# Patient Record
Sex: Male | Born: 1950 | Race: Black or African American | Hispanic: No | Marital: Married | Smoking: Never smoker
Health system: Southern US, Community
[De-identification: ages and names within clinical notes are randomized; demographics above are authoritative.]

## PROBLEM LIST (undated history)

## (undated) DIAGNOSIS — I10 Essential (primary) hypertension: Secondary | ICD-10-CM

---

## 2006-08-22 ENCOUNTER — Encounter (INDEPENDENT_AMBULATORY_CARE_PROVIDER_SITE_OTHER): Payer: Self-pay | Admitting: Gastroenterology

## 2006-08-22 ENCOUNTER — Ambulatory Visit (HOSPITAL_COMMUNITY): Admission: RE | Admit: 2006-08-22 | Discharge: 2006-08-22 | Payer: Self-pay | Admitting: Gastroenterology

## 2009-12-21 ENCOUNTER — Emergency Department (HOSPITAL_COMMUNITY): Admission: EM | Admit: 2009-12-21 | Discharge: 2009-12-21 | Payer: Self-pay | Admitting: Emergency Medicine

## 2010-04-21 LAB — POCT CARDIAC MARKERS
CKMB, poc: 1.2 ng/mL (ref 1.0–8.0)
CKMB, poc: <1 ng/mL — ABNORMAL LOW (ref 1.0–8.0)
Myoglobin, poc: 117 ng/mL (ref 12–200)
Myoglobin, poc: 130 ng/mL (ref 12–200)
Troponin i, poc: <0.05 ng/mL (ref 0.00–0.09)
Troponin i, poc: <0.05 ng/mL (ref 0.00–0.09)

## 2010-04-21 LAB — CBC
HCT: 35.5 % — ABNORMAL LOW (ref 39.0–52.0)
Hemoglobin: 11.5 g/dL — ABNORMAL LOW (ref 13.0–17.0)
MCH: 24.6 pg — ABNORMAL LOW (ref 26.0–34.0)
MCHC: 32.4 g/dL (ref 30.0–36.0)
MCV: 75.9 fL — ABNORMAL LOW (ref 78.0–100.0)
Platelets: 247 10*3/uL (ref 150–400)
RBC: 4.68 MIL/uL (ref 4.22–5.81)
RDW: 13.5 % (ref 11.5–15.5)
WBC: 4.4 10*3/uL (ref 4.0–10.5)

## 2010-04-21 LAB — DIFFERENTIAL
Basophils Absolute: 0 10*3/uL (ref 0.0–0.1)
Basophils Relative: 0 % (ref 0–1)
Eosinophils Absolute: 0.1 10*3/uL (ref 0.0–0.7)
Eosinophils Relative: 2 % (ref 0–5)
Lymphocytes Relative: 31 % (ref 12–46)
Lymphs Abs: 1.4 10*3/uL (ref 0.7–4.0)
Monocytes Absolute: 0.4 10*3/uL (ref 0.1–1.0)
Monocytes Relative: 9 % (ref 3–12)
Neutro Abs: 2.5 10*3/uL (ref 1.7–7.7)
Neutrophils Relative %: 57 % (ref 43–77)

## 2010-04-21 LAB — URINALYSIS, ROUTINE W REFLEX MICROSCOPIC
Bilirubin Urine: NEGATIVE
Glucose, UA: NEGATIVE mg/dL
Hgb urine dipstick: NEGATIVE
Ketones, ur: NEGATIVE mg/dL
Nitrite: NEGATIVE
Protein, ur: NEGATIVE mg/dL
Specific Gravity, Urine: 1.022 (ref 1.005–1.030)
Urobilinogen, UA: 0.2 mg/dL (ref 0.0–1.0)
pH: 6.5 (ref 5.0–8.0)

## 2010-04-21 LAB — BASIC METABOLIC PANEL
BUN: 16 mg/dL (ref 6–23)
CO2: 29 mEq/L (ref 19–32)
Calcium: 9.7 mg/dL (ref 8.4–10.5)
Chloride: 101 mEq/L (ref 96–112)
Creatinine, Ser: 1.5 mg/dL (ref 0.4–1.5)
GFR calc Af Amer: 58 mL/min — ABNORMAL LOW (ref >60)
GFR calc non Af Amer: 48 mL/min — ABNORMAL LOW (ref >60)
Glucose, Bld: 109 mg/dL — ABNORMAL HIGH (ref 70–99)
Potassium: 4.3 mEq/L (ref 3.5–5.1)
Sodium: 136 mEq/L (ref 135–145)

## 2010-06-23 NOTE — Op Note (Signed)
NAME:  Blake Mcclure, Blake Mcclure NO.:  [id number]   MEDICAL RECORD NO.:  [id number]          PATIENT TYPE:  AMB   LOCATION:  ENDO                         FACILITY:  [facility]   PHYSICIAN:  Anselmo Rod, M.D.  DATE OF BIRTH:  [birth year]   DATE OF PROCEDURE:  08/22/2006  DATE OF DISCHARGE:                               OPERATIVE REPORT   ROCEDURE PERFORMED:  Colonoscopy with cold biopsies x2.   ENDOSCOPIST:  Anselmo Rod, MD   INSTRUMENT USED:  Pentax video colonoscope.   INDICATIONS FOR PROCEDURE:  A 60 year old African American male  undergoing screening colonoscopy to rule out colonic polyps, masses,  etc.   PREPROCEDURE PREPARATION:  Informed consent was procured from the  patient. The patient was fasted for 4 hours prior to the procedure and  prepped with 20 Osmoprep pills the night of and 12 Osmoprep pills the  morning of the procedure.  Risks and benefits of the procedure including  a 10% miss rate of cancer in polyps were discussed with the patient as  well.   PREPROCEDURE PHYSICAL:  VITAL SIGNS:  The patient had stable vital  signs.  NECK:  Supple.  CHEST:  Clear to auscultation.  S1 and S2 regular.  ABDOMEN:  Soft with normal bowel sounds.   DESCRIPTION OF THE PROCEDURE:  The patient was placed in the left  lateral decubitus position and sedated with 100 mcg of Fetanyl and 10 mg  of Versed given intravenously in slow incremental doses. Once the  patient was adequately sedated and maintained on low-flow oxygen and  continuous cardiac monitoring, the Pentax video colonoscope was advanced  from the rectum to the cecum.  Two small sessile polyps were biopsied  from the rectum. The colon was otherwise normal.  The prep was  excellent.  The appendiceal orifice and ileocecal valve were visualized  and photographed.  The terminal ileum was not visualized.  No other  masses or polyps were seen.  There was no evidence of diverticulosis.  The patient tolerated the  procedure well without complications.  Retroflexion in the rectum revealed no abnormalities.   IMPRESSION:  1. Two small sessile polyps biopsied from the rectum, otherwise normal      exam up to the cecum.  2. No abnormalities noted on retroflexion.  3. No evidence of diverticulosis, hemorrhoids, and so forth.   RECOMMENDATIONS:  1. Await pathology results.  2. Avoid all nonsteroidals including aspirin for the next 2 weeks.  3. Repeat colonoscopy depending on pathology results.  4. Outpatient followup as need arises in the future.      Anselmo Rod, M.D.  Electronically Signed     JNM/MEDQ  D:  08/22/2006  T:  08/23/2006  Job:  [id number]   cc:   Olene Craven, M.D.  Fax: [phone]

## 2014-12-23 DIAGNOSIS — K219 Gastro-esophageal reflux disease without esophagitis: Secondary | ICD-10-CM | POA: Insufficient documentation

## 2018-11-08 DIAGNOSIS — N529 Male erectile dysfunction, unspecified: Secondary | ICD-10-CM | POA: Insufficient documentation

## 2019-02-06 ENCOUNTER — Ambulatory Visit: Payer: Medicare Other | Attending: Internal Medicine

## 2019-02-06 DIAGNOSIS — Z20822 Contact with and (suspected) exposure to covid-19: Secondary | ICD-10-CM

## 2019-02-06 DIAGNOSIS — Z20828 Contact with and (suspected) exposure to other viral communicable diseases: Secondary | ICD-10-CM | POA: Insufficient documentation

## 2019-02-07 LAB — NOVEL CORONAVIRUS, NAA: SARS-CoV-2, NAA: NOT DETECTED

## 2019-03-09 ENCOUNTER — Encounter (HOSPITAL_COMMUNITY): Payer: Self-pay

## 2019-03-09 ENCOUNTER — Ambulatory Visit (HOSPITAL_COMMUNITY)
Admission: EM | Admit: 2019-03-09 | Discharge: 2019-03-09 | Disposition: A | Discharge status: Home / Self Care | Payer: TRICARE For Life (TFL) | Attending: Family Medicine | Admitting: Family Medicine

## 2019-03-09 ENCOUNTER — Other Ambulatory Visit: Payer: Self-pay

## 2019-03-09 DIAGNOSIS — M7061 Trochanteric bursitis, right hip: Secondary | ICD-10-CM | POA: Diagnosis not present

## 2019-03-09 MED ORDER — NAPROXEN 500 MG PO TABS: 500.0000 mg | ORAL_TABLET | Freq: Two times a day (BID) | ORAL | 0 refills | Status: DC | PRN

## 2019-03-09 MED ORDER — METHYLPREDNISOLONE ACETATE 40 MG/ML IJ SUSP
INTRAMUSCULAR | Status: AC
  Filled 2019-03-09: qty 1

## 2019-03-09 MED ORDER — METHYLPREDNISOLONE ACETATE 40 MG/ML IJ SUSP
40.0000 mg | Freq: Once | INTRAMUSCULAR | Status: AC
  Administered 2019-03-09: 18:00:00 40 mg via INTRAMUSCULAR

## 2019-03-09 MED ORDER — BUPIVACAINE HCL (PF) 0.5 % IJ SOLN
INTRAMUSCULAR | Status: AC
  Filled 2019-03-09: qty 10

## 2019-03-09 NOTE — ED Provider Notes (Signed)
[name]    CSN: [id number] Arrival date & time: 03/09/19  1701      History   Chief Complaint Chief Complaint  Patient presents with  . Sciatica    HPI Blake Mcclure is a 69 y.o. male.   He is presenting with right hip pain.  The pain is been ongoing for about a week.  He has seen his primary doctor and was prescribed meloxicam and Robaxin with limited improvement.  The pain seems to be mostly occurring over the lateral aspect of the right hip.  He denies any injury or inciting event.  No history of similar pain.  He denies any radicular symptoms.  Pain is worse with any movement or flexion.  HPI  History reviewed. No pertinent past medical history.  There are no problems to display for this patient.   History reviewed. No pertinent surgical history.     Home Medications    Prior to Admission medications   Medication Sig Start Date End Date Taking? Authorizing Provider  meloxicam (MOBIC) 15 MG tablet meloxicam 15 mg tablet  Take 1 tablet every day by oral route as needed.   Yes [provider]  methocarbamol (ROBAXIN) 500 MG tablet methocarbamol 500 mg tablet  Take 2 tablets 4 times a day by oral route as needed.   Yes [provider]  losartan (COZAAR) 50 MG tablet losartan 50 mg tablet    [provider]  naproxen (NAPROSYN) 500 MG tablet Take 1 tablet (500 mg total) by mouth 2 (two) times daily as needed. 03/09/19 03/08/20  Rosemarie Ax, MD    Family History Family History  Problem Relation Age of Onset  . Healthy Mother   . Healthy Father     Social History Social History   Tobacco Use  . Smoking status: Never Smoker  . Smokeless tobacco: Never Used  Substance Use Topics  . Alcohol use: Not Currently  . Drug use: Not on file     Allergies   Patient has no known allergies.   Review of Systems Review of Systems See HPI   Physical Exam Triage Vital Signs ED Triage Vitals  Enc Vitals Group   BP      Pulse      Resp      Temp      Temp src      SpO2      Weight      Height      Head Circumference      Peak Flow      Pain Score      Pain Loc      Pain Edu?      Excl. in [place]?    No data found.  Updated Vital Signs BP (!) 180/96 (BP Location: Right Arm)   Pulse 70   Temp 98.1 F (36.7 C) (Oral)   Resp 16   SpO2 98%   Visual Acuity Right Eye Distance:   Left Eye Distance:   Bilateral Distance:    Right Eye Near:   Left Eye Near:    Bilateral Near:     Physical Exam Gen: NAD, alert, cooperative with exam, well-appearing ENT: normal lips, normal nasal mucosa,  Skin: no rashes, no areas of induration  Neuro: normal tone, normal sensation to touch Psych:  normal insight, alert and oriented MSK:  Right hip/back:  TTP over the greater trochanter. Pain with hip flexion. Normal internal and external rotation. Negative straight leg raise. Neurovascularly  intact   Aspiration/Injection Procedure Note Loyde Rzepecki Riemann April 06, 1950  Procedure: Injection Indications: Right hip pain  Procedure Details Consent: Risks of procedure as well as the alternatives and risks of each were explained to the (patient/caregiver).  Consent for procedure obtained. Time Out: Verified patient identification, verified procedure, site/side was marked, verified correct patient position, special equipment/implants available, medications/allergies/relevent history reviewed, required imaging and test results available.  Performed.  The area was cleaned with iodine and alcohol swabs.    The right greater trochanteric area was injected using 1 cc's of 40 mg Depo-Medrol and 4 cc's of 0.5% bupivacaine with a 22 3 1/2" needle.     A sterile dressing was applied.  Patient did tolerate procedure well.    UC Treatments / Results  Labs (all labs ordered are listed, but only abnormal results are displayed) Labs Reviewed - No data to display  EKG   Radiology No results  found.  Procedures Procedures (including critical care time)  Medications Ordered in UC Medications  methylPREDNISolone acetate (DEPO-MEDROL) injection 40 mg (40 mg Intramuscular Given by Other 03/09/19 1816)    Initial Impression / Assessment and Plan / UC Course  I have reviewed the triage vital signs and the nursing notes.  Pertinent labs & imaging results that were available during my care of the patient were reviewed by me and considered in my medical decision making (see chart for details).     Blake Mcclure is a 69 year old male that is presenting with right hip pain is suggestive greater trochanteric pain syndrome.  Seems less likely for radicular symptoms at this time.  Has good rotation of the hip.  Provided with injection of the greater trochanteric area.  We will stop the meloxicam and initiate naproxen.  Can continue the Robaxin.  Counseled on home exercise therapy and supportive care.  Can consider imaging if no improvement.  Final Clinical Impressions(s) / UC Diagnoses   Final diagnoses:  Greater trochanteric bursitis of right hip     Discharge Instructions     Please stop the mobic  Please take the naproxen for 4 days straight and then as needed  Please try ice on the area  Please try the exercises  Please follow up if your symptoms fail to improve.     ED Prescriptions    Medication Sig Dispense Auth. Provider   naproxen (NAPROSYN) 500 MG tablet Take 1 tablet (500 mg total) by mouth 2 (two) times daily as needed. 60 tablet Rosemarie Ax, MD     PDMP not reviewed this encounter.   Rosemarie Ax, MD 03/09/19 [phone]

## 2019-03-09 NOTE — Discharge Instructions (Addendum)
Please stop the mobic  Please take the naproxen for 4 days straight and then as needed  Please try ice on the area  Please try the exercises  Please follow up if your symptoms fail to improve.

## 2019-03-09 NOTE — ED Triage Notes (Signed)
Patient presents to Urgent Care with complaints of right hip pain that ridiates down his right leg since two weeks ago. Patient reports the only strenuous thing he can remember is raking leaves out of the yard.  Pt was given robaxin and meloxicam by his PCP but states they are not helping.

## 2019-03-14 ENCOUNTER — Ambulatory Visit (INDEPENDENT_AMBULATORY_CARE_PROVIDER_SITE_OTHER): Payer: Medicare Other

## 2019-03-14 ENCOUNTER — Ambulatory Visit (INDEPENDENT_AMBULATORY_CARE_PROVIDER_SITE_OTHER): Payer: Medicare Other | Admitting: Orthopaedic Surgery

## 2019-03-14 ENCOUNTER — Other Ambulatory Visit: Payer: Self-pay

## 2019-03-14 DIAGNOSIS — M25512 Pain in left shoulder: Secondary | ICD-10-CM

## 2019-03-14 DIAGNOSIS — M25551 Pain in right hip: Secondary | ICD-10-CM

## 2019-03-14 DIAGNOSIS — G8929 Other chronic pain: Secondary | ICD-10-CM

## 2019-03-14 DIAGNOSIS — M545 Low back pain, unspecified: Secondary | ICD-10-CM

## 2019-03-14 MED ORDER — METHYLPREDNISOLONE ACETATE 40 MG/ML IJ SUSP
40.0000 mg | INTRAMUSCULAR | Status: AC | PRN
  Administered 2019-03-14: 10:00:00 40 mg via INTRA_ARTICULAR

## 2019-03-14 MED ORDER — LIDOCAINE HCL 1 % IJ SOLN
3.0000 mL | INTRAMUSCULAR | Status: AC | PRN
  Administered 2019-03-14: 3 mL

## 2019-03-14 MED ORDER — TRAMADOL HCL 50 MG PO TABS: 100.0000 mg | ORAL_TABLET | Freq: Four times a day (QID) | ORAL | 0 refills | Status: DC | PRN

## 2019-03-14 NOTE — Progress Notes (Signed)
Office Visit Note   Patient: Blake Mcclure           Date of Birth: 08-17-1950           MRN: [id number] Visit Date: 03/14/2019              Requested by: Willey Blade, [address] [place],  [place] [address] PCP: Willey Blade, MD   Assessment & Plan: Visit Diagnoses:  1. Acute pain of left shoulder   2. Pain in right hip     Plan: I spoke to the patient in length in detail about his left shoulder and right hip.  I do feel that his issues with his hip are more related to his spine.  I recommend a steroid injection in his left shoulder.  He did tolerate this well.  This was in the subacromial space.  He is someone who is so active I do feel that he would benefit most from outpatient physical therapy to work on his low back and pelvic pain to the right side as well as his left shoulder.  Any modalities could be helpful.  He agrees with this treatment plan.  We will work on scheduling outpatient physical therapy.  Also will try some tramadol for his pain.  All questions and concerns were answered and addressed.  Follow-Up Instructions: Return in about 4 weeks (around 04/11/2019).   Orders:  Orders Placed This Encounter  Procedures  . Large Joint Inj  . Large Joint Inj  . XR Shoulder Left  . XR HIP UNILAT W OR W/O PELVIS 1V RIGHT   Meds ordered this encounter  Medications  . traMADol (ULTRAM) 50 MG tablet    Sig: Take 2 tablets (100 mg total) by mouth every 6 (six) hours as needed.    Dispense:  40 tablet    Refill:  0      Procedures: Large Joint Inj: L subacromial bursa on 03/14/2019 9:53 AM Indications: pain and diagnostic evaluation Details: 22 G 1.5 in needle  Arthrogram: No  Medications: 3 mL lidocaine 1 %; 40 mg methylPREDNISolone acetate 40 MG/ML Outcome: tolerated well, no immediate complications Procedure, treatment alternatives, risks and benefits explained, specific risks discussed. Consent was given by the patient. Immediately  prior to procedure a time out was called to verify the correct patient, procedure, equipment, support staff and site/side marked as required. Patient was prepped and draped in the usual sterile fashion.       Clinical Data: No additional findings.   Subjective: Chief Complaint  Patient presents with  . Left Shoulder - Pain  . Right Hip - Pain  Patient is a very pleasant 69 year old gentleman seen for the first time.  He is here for 2 issues.  Has been having left shoulder pain and right hip pain.  With his hip though he points to the posterior pelvis area and the lower lumbar spine as a source of his pain.  He does report occasional groin pain.  He is also left-hand-dominant has been having pain with overhead activities and reaching beside him and behind with his left arm.  He is very athletic individual 54 a very muscular.  He does a lot of outdoor work and indoor work.  He is never injured his shoulder or hip or back before.  He is never had surgery on these areas.  He stated that his primary care physician gave him an injection over his right hip area just recently and that did  not help much at all.  He is on meloxicam and Robaxin.  HPI  Review of Systems He currently denies any headache, chest pain, shortness of breath, fever, chills, nausea, vomiting  Objective: Vital Signs: There were no vitals taken for this visit.  Physical Exam He is alert and orient x3 and in no acute distress Ortho Exam Examination of his left shoulder shows full range of motion that is painful with positive Neer and Hawkins sign.  His rotator cuff itself feels strong.  He has some decreased rotation with reaching behind him compared to his other side.  His liftoff is negative.  Examination of his right hip shows that it moves smoothly and fluidly.  There is no pain to relation the groin area or the trochanteric area or compression of the hip causing him any type of pain.  His pain seems to be at the lower  lumbar spine to the right side.  He does have a positive straight leg raise on the right side. Specialty Comments:  No specialty comments available.  Imaging: XR HIP UNILAT W OR W/O PELVIS 1V RIGHT  Result Date: 03/14/2019 A low AP pelvis and lateral right hip shows no acute findings.  The hip joint is well located.  There is no significant arthritic changes.  There is a large calcification bone spur above the acetabular rim potentially indicative of old injury to the hip flexors or quad muscle insertion.  XR Shoulder Left  Result Date: 03/14/2019 3 views of the right shoulder show no acute findings.  The shoulder is well located.  There is mild narrowing of the AC joint.    PMFS History: There are no problems to display for this patient.  No past medical history on file.  Family History  Problem Relation Age of Onset  . Healthy Mother   . Healthy Father     No past surgical history on file. Social History   Occupational History  . Not on file  Tobacco Use  . Smoking status: Never Smoker  . Smokeless tobacco: Never Used  Substance and Sexual Activity  . Alcohol use: Not Currently  . Drug use: Not on file  . Sexual activity: Not on file

## 2019-03-21 ENCOUNTER — Ambulatory Visit (INDEPENDENT_AMBULATORY_CARE_PROVIDER_SITE_OTHER): Payer: Medicare Other | Admitting: Physical Therapy

## 2019-03-21 ENCOUNTER — Encounter: Payer: Self-pay | Admitting: Physical Therapy

## 2019-03-21 ENCOUNTER — Other Ambulatory Visit: Payer: Self-pay | Admitting: Orthopaedic Surgery

## 2019-03-21 ENCOUNTER — Other Ambulatory Visit: Payer: Self-pay

## 2019-03-21 DIAGNOSIS — R2689 Other abnormalities of gait and mobility: Secondary | ICD-10-CM | POA: Diagnosis not present

## 2019-03-21 DIAGNOSIS — M545 Low back pain, unspecified: Secondary | ICD-10-CM

## 2019-03-21 DIAGNOSIS — M25512 Pain in left shoulder: Secondary | ICD-10-CM

## 2019-03-21 DIAGNOSIS — M25551 Pain in right hip: Secondary | ICD-10-CM

## 2019-03-21 MED ORDER — METHYLPREDNISOLONE 4 MG PO TABS: ORAL_TABLET | ORAL | 0 refills | Status: DC

## 2019-03-21 MED ORDER — ACETAMINOPHEN-CODEINE #3 300-30 MG PO TABS: 1.0000 | ORAL_TABLET | Freq: Three times a day (TID) | ORAL | 0 refills | Status: DC | PRN

## 2019-03-21 NOTE — Therapy (Signed)
[facility] [address] [place], [place], [address] Phone: [phone]   Fax:  [phone]  Physical Therapy Evaluation  Patient Details  Name: Blake Mcclure MRN: [id number] Date of Birth: 05/13/1950 Referring Provider (PT): Ninfa Linden, MD   Encounter Date: 03/21/2019  PT End of Session - 03/21/19 0929    Visit Number  1    Number of Visits  12    Date for PT Re-Evaluation  05/02/19    Authorization Type  M[employer]    PT Start Time  0805    PT Stop Time  0850    PT Time Calculation (min)  45 min    Activity Tolerance  Patient tolerated treatment well    Behavior During Therapy  [facility] for tasks assessed/performed       History reviewed. No pertinent past medical history.  History reviewed. No pertinent surgical history.  There were no vitals filed for this visit.   Subjective Assessment - 03/21/19 0808    Subjective  Pt relays 3 weeks ago he was raking his yard and then noticed pain that evening in his back, Rt hip and Left shoulder .He has been in a lot of pain since, saw ortho and PCP and had injections which did not help. Pain is worse in the morning.    Pertinent History  no significant PMH    Limitations  Standing;Walking;House hold activities    How long can you stand comfortably?  depends    How long can you walk comfortably?  depends    Diagnostic tests  "Mild narrowing of the AC jt otherwise neg shoulder XR. hip XR There is a large calcification bone spur above the acetabular rim potentially indicative of old injury to the hip flexors or quad muscle insertion"    Patient Stated Goals  get the pain down    Currently in Pain?  Yes    Pain Score  4     Pain Location  Back   Right back and Lt shoulder   Pain Type  Chronic pain    Pain Radiating Towards  numbness down Rt leg for last 3 days    Pain Onset  More than a month ago    Pain Frequency  Intermittent    Aggravating Factors   standing, walking, reaching overhead     Pain Relieving Factors  rest, pain meds    Effect of Pain on Daily Activities  limits most ADL's         [facility] PT Assessment - 03/21/19 0001      Assessment   Medical Diagnosis  right low back pain/pelvis pain with radiculopathy; left shoulder pain    Referring Provider (PT)  Ninfa Linden, MD    Onset Date/Surgical Date  --   3 week onset of pain   Hand Dominance  Left    Next MD Visit  04/11/19    Prior Therapy  none      Precautions   Precautions  None      Restrictions   Weight Bearing Restrictions  No      Balance Screen   Has the patient fallen in the past 6 months  No      [place] residence      Prior Function   Level of Independence  Independent    Vocation  Part time employment    Vocation Requirements  supply Electronics engineer    Leisure  yardwork  Cognition   Overall Cognitive Status  Within Functional Limits for tasks assessed      Posture/Postural Control   Posture Comments  lateral shift Rt shoulders in relation to hips      ROM / Strength   AROM / PROM / Strength  AROM;Strength;PROM      AROM   AROM Assessment Site  Shoulder;Lumbar    Right/Left Shoulder  Left    Left Shoulder Flexion  90 Degrees    Left Shoulder ABduction  90 Degrees    Left Shoulder Internal Rotation  --   WNL   Left Shoulder External Rotation  --   ear   Lumbar Flexion  WNL    Lumbar Extension  50%    Lumbar - Right Side Bend  25%    Lumbar - Left Side Bend  50%    Lumbar - Right Rotation  25%    Lumbar - Left Rotation  50%      PROM   Overall PROM Comments  Lt shoulder PROM about 75% but more limited by guarding than hypomobility      Strength   Overall Strength Comments  Lt shoulder strength overall 4+/5 but more limited by pain than weakness      Flexibility   Soft Tissue Assessment /Muscle Length  --   tight hamstings, hip rotators, lumbar P.S.     Palpation   Spinal mobility  mildly hypomobile    SI assessment   inconclusive  SI tests, pain with palpation to SI joint      Special Tests   Other special tests  neg slump test, negative SLR but pain upon coming back down, + FABERS on Rt, no change with repeated movements or lateral shift correction      Ambulation/Gait   Gait Comments  antalgic gait on left with decreased weight shift and stance time                Objective measurements completed on examination: See above findings.      [facility] Adult PT Treatment/Exercise - 03/21/19 0001      Modalities   Modalities  Cryotherapy;Electrical Stimulation      Cryotherapy   Number Minutes Cryotherapy  10 Minutes    Cryotherapy Location  Lumbar Spine;Hip    Type of Cryotherapy  Ice pack      Electrical Stimulation   Electrical Stimulation Location  Rt lumbar/hip    Electrical Stimulation Action  IFC    Electrical Stimulation Parameters  tolerance in sidelying on his Lt     Electrical Stimulation Goals  Tone;Pain      Manual Therapy   Manual therapy comments  long axis distraction to Rt LE in botth ER and IR 2 min each             PT Education - 03/21/19 [id number]    Education Details  HEP, POC, exam findings, TENS    Person(s) Educated  Patient    Methods  Explanation;Demonstration;Verbal cues;Handout    Comprehension  Verbalized understanding;Need further instruction          PT Long Term Goals - 03/21/19 0948      PT LONG TERM GOAL #1   Title  Pt will be I and compliant with HEP. (target for all LTG 6 weeks 05/02/19)    Status  New      PT LONG TERM GOAL #2   Title  Pt will improve lumbar and Lt shoulder AROM to WNL  Status  New      PT LONG TERM GOAL #3   Title  Pt will improve Lt shoulder strength and Rt hip strength to 5/5    Status  New      PT LONG TERM GOAL #4   Title  Pt will report less than 2-3 overall pain with usual activities    Status  New      PT LONG TERM GOAL #5   Title  Pt will have WNL gait pattern for community ambulation    Status  New              Plan - 03/21/19 [id number]    Clinical Impression Statement  Pt presents with signs and symptoms of acute Rt sided low back/hip pain/SI joint pain with Rt radiculopathy as well as Left shoulder pain and impingment. He has overall decreased shoulder and back ROM, decreased Left shoulder strength, decreased Rt hip strength, and decreased activity tolerance for standing, walking, and reaching. He will benefit from skilled PT to address his deficits .    Examination-Activity Limitations  Lift;Stand;Stairs;Squat;Sleep;Carry;Locomotion Level    Examination-Participation Restrictions  Cleaning;Community Activity;Yard Work;Shop    Stability/Clinical Decision Making  Evolving/Moderate complexity    Clinical Decision Making  Moderate    Rehab Potential  Good    PT Frequency  2x / week    PT Duration  6 weeks    PT Treatment/Interventions  ADLs/Self Care Home Management;Cryotherapy;Electrical Stimulation;Iontophoresis 4mg /ml Dexamethasone;Moist Heat;Traction;Ultrasound;Gait training;Therapeutic activities;Therapeutic exercise;Neuromuscular re-education;Manual techniques;Passive range of motion;Dry needling;Taping;Spinal Manipulations;Joint Manipulations    PT Next Visit Plan  review and update HEP PRN, consider modalaties, manual, DN, manipulations, traction PRN, needs Left scap stability and Rt hip strength, lumbar and hip stretching    PT Home Exercise Plan  Access Code: [email or web address]    Consulted and Agree with Plan of Care  Patient       Patient will benefit from skilled therapeutic intervention in order to improve the following deficits and impairments:  Abnormal gait, Decreased activity tolerance, Decreased mobility, Decreased range of motion, Difficulty walking, Decreased strength, Increased muscle spasms, Impaired flexibility, Postural dysfunction, Pain  Visit Diagnosis: Acute right-sided low back pain, unspecified whether sciatica present  Pain in right hip  Other abnormalities of gait  and mobility  Acute pain of left shoulder     Problem List There are no problems to display for this patient.   Silvestre Mesi 03/21/2019, 9:58 AM  [facility] [address] [place], [place], [address] Phone: [phone]   Fax:  [phone]  Name: Blake Mcclure MRN: [id number] Date of Birth: October 25, 1950

## 2019-03-21 NOTE — Patient Instructions (Signed)
Access Code: [email or web address]  URL: [email or web address]  Date: 03/21/2019  Prepared by: Elsie Ra   Exercises  Seated Hamstring Stretch - 2 reps - 1 sets - 30 hold - 2x daily - 6x weekly  Supine Lower Trunk Rotation - 10 reps - 1 sets - 5 sec hold - 2x daily - 6x weekly  Single Knee to Chest Stretch - 2 reps - 1 sets - 30 hold - 2x daily - 6x weekly  Supine Piriformis Stretch with Foot on Ground - 3 sets - 30 hold - 2x daily - 6x weekly  Prone on Elbows Stretch - 3 reps - 1 sets - 60 sec hold - 2x daily - 6x weekly  Supine Bridge - 10 reps - 1-2 sets - 5 hold - 2x daily - 6x weekly  Doorway Pec Stretch at 90 Degrees Abduction - 3 reps - 1 sets - 30 hold - 2x daily - 3-4x weekly  Prone Single Arm Shoulder Y - 10 reps - 3 sets - 2x daily - 3-4x weekly  Prone Shoulder Horizontal Abduction - 10 reps - 2-3 sets - 3 sec hold - 2x daily - 3-4x weekly  Patient Education  TENS Therapy

## 2019-03-28 ENCOUNTER — Encounter: Payer: Self-pay | Admitting: Rehabilitative and Restorative Service Providers"

## 2019-03-28 ENCOUNTER — Ambulatory Visit (INDEPENDENT_AMBULATORY_CARE_PROVIDER_SITE_OTHER): Payer: Medicare Other | Admitting: Rehabilitative and Restorative Service Providers"

## 2019-03-28 ENCOUNTER — Other Ambulatory Visit: Payer: Self-pay

## 2019-03-28 DIAGNOSIS — M545 Low back pain, unspecified: Secondary | ICD-10-CM

## 2019-03-28 DIAGNOSIS — M25551 Pain in right hip: Secondary | ICD-10-CM | POA: Diagnosis not present

## 2019-03-28 DIAGNOSIS — M25512 Pain in left shoulder: Secondary | ICD-10-CM

## 2019-03-28 DIAGNOSIS — R2689 Other abnormalities of gait and mobility: Secondary | ICD-10-CM

## 2019-03-28 NOTE — Patient Instructions (Signed)
Access Code: [email or web address] URL: [email or web address] Date: 03/28/2019 Prepared by: Scot Jun  Exercises Supine Bridge - 10 reps - 3 sets - 2 hold - 2x daily - 7x weekly Supine Lower Trunk Rotation - 5 reps - 1 sets - 15 hold - 2x daily - 7x weekly Clamshell - 10 reps - 3 sets - 2x daily - 7x weekly Standing Shoulder Row with Anchored Resistance - 10 reps - 3 sets - 2x daily - 7x weekly Shoulder Extension with Resistance - 10 reps - 3 sets - 2x daily - 7x weekly

## 2019-03-28 NOTE — Therapy (Signed)
[facility] Physical Therapy [address] [place], [place], [address] Phone: [phone]   Fax:  [phone]  Physical Therapy Treatment  Patient Details  Name: Blake Mcclure MRN: [id number] Date of Birth: 08/22/1950 Referring Provider (PT): Ninfa Linden, MD   Encounter Date: 03/28/2019  PT End of Session - 03/28/19 0840    Visit Number  2    Number of Visits  12    Date for PT Re-Evaluation  05/02/19    Authorization Type  MCR/Tricare    PT Start Time  0800    PT Stop Time  0840    PT Time Calculation (min)  40 min    Activity Tolerance  Patient tolerated treatment well    Behavior During Therapy  [facility] for tasks assessed/performed       History reviewed. No pertinent past medical history.  History reviewed. No pertinent surgical history.  There were no vitals filed for this visit.  Subjective Assessment - 03/28/19 0801    Subjective  Pt. indicate complaints primarily c transfers.    Pertinent History  no significant PMH    Limitations  Standing;Walking;House hold activities    How long can you stand comfortably?  depends    How long can you walk comfortably?  depends    Diagnostic tests  "Mild narrowing of the AC jt otherwise neg shoulder XR. hip XR There is a large calcification bone spur above the acetabular rim potentially indicative of old injury to the hip flexors or quad muscle insertion"    Patient Stated Goals  get the pain down    Pain Score  3     Pain Location  Hip    Pain Orientation  Right    Pain Type  Chronic pain    Pain Onset  More than a month ago                       [facility] Adult PT Treatment/Exercise - 03/28/19 0001      Exercises   Exercises  Lumbar;Knee/Hip;Shoulder      Lumbar Exercises: Stretches   Lower Trunk Rotation  5 reps   15 seconds bilaterally     Lumbar Exercises: Aerobic   Nustep  L6 10 mins      Lumbar Exercises: Supine   Clam  Other (comment)   3 x 10   Bridge  Other (comment)   3 x  10     Shoulder Exercises: Standing   Extension  Strengthening;Both   3 x 10   Theraband Level (Shoulder Extension)  Level 3 (Green)    Medical sales representative;Other (comment)   3x 10   Theraband Level (Shoulder Row)  Level 3 Nyoka Cowden)             PT Education - 03/28/19 0804    Education Details  Review of HEP    Methods  Explanation;Verbal cues    Comprehension  Verbalized understanding;Returned demonstration          PT Long Term Goals - 03/21/19 0948      PT LONG TERM GOAL #1   Title  Pt will be I and compliant with HEP. (target for all LTG 6 weeks 05/02/19)    Status  New      PT LONG TERM GOAL #2   Title  Pt will improve lumbar and Lt shoulder AROM to WNL    Status  New      PT LONG TERM GOAL #3   Title  Pt will improve Lt shoulder strength and Rt hip strength to 5/5    Status  New      PT LONG TERM GOAL #4   Title  Pt will report less than 2-3 overall pain with usual activities    Status  New      PT LONG TERM GOAL #5   Title  Pt will have WNL gait pattern for community ambulation    Status  New            Plan - 03/28/19 [id number]    Clinical Impression Statement  Presentation indicated R posterior/lateral hip complaints during functional sit to stand, squat mobility that improved c repetitions.  Added HEP to encouraged improved movement and strength in affected movements.    Examination-Activity Limitations  Lift;Stand;Stairs;Squat;Sleep;Carry;Locomotion Level    Examination-Participation Restrictions  Cleaning;Community Activity;Yard Work;Shop    Stability/Clinical Decision Making  Evolving/Moderate complexity    Rehab Potential  Good    PT Frequency  2x / week    PT Duration  6 weeks    PT Treatment/Interventions  ADLs/Self Care Home Management;Cryotherapy;Electrical Stimulation;Iontophoresis 4mg /ml Dexamethasone;Moist Heat;Traction;Ultrasound;Gait training;Therapeutic activities;Therapeutic exercise;Neuromuscular re-education;Manual techniques;Passive  range of motion;Dry needling;Taping;Spinal Manipulations;Joint Manipulations    PT Next Visit Plan  Continued mobility based intervention to improve functional activity.    PT Home Exercise Plan  [email or web address]    Consulted and Agree with Plan of Care  Patient       Patient will benefit from skilled therapeutic intervention in order to improve the following deficits and impairments:  Abnormal gait, Decreased activity tolerance, Decreased mobility, Decreased range of motion, Difficulty walking, Decreased strength, Increased muscle spasms, Impaired flexibility, Postural dysfunction, Pain  Visit Diagnosis: Acute right-sided low back pain, unspecified whether sciatica present  Pain in right hip  Other abnormalities of gait and mobility  Acute pain of left shoulder     Problem List There are no problems to display for this patient.   Scot Jun, PT, DPT, OCS, ATC 03/28/19  8:41 AM    [facility] Physical Therapy [address] [place], [place], [address] Phone: [phone]   Fax:  [phone]  Name: Gardiner Sydow MRN: [id number] Date of Birth: Mar 30, 1950

## 2019-03-29 ENCOUNTER — Encounter: Payer: Self-pay | Admitting: Physical Therapy

## 2019-03-30 ENCOUNTER — Encounter: Payer: Medicare Other | Admitting: Rehabilitative and Restorative Service Providers"

## 2019-04-03 ENCOUNTER — Other Ambulatory Visit: Payer: Self-pay

## 2019-04-03 ENCOUNTER — Ambulatory Visit (INDEPENDENT_AMBULATORY_CARE_PROVIDER_SITE_OTHER): Payer: Medicare Other | Admitting: Rehabilitative and Restorative Service Providers"

## 2019-04-03 ENCOUNTER — Encounter: Payer: Self-pay | Admitting: Rehabilitative and Restorative Service Providers"

## 2019-04-03 DIAGNOSIS — R2689 Other abnormalities of gait and mobility: Secondary | ICD-10-CM

## 2019-04-03 DIAGNOSIS — M545 Low back pain, unspecified: Secondary | ICD-10-CM

## 2019-04-03 DIAGNOSIS — M25512 Pain in left shoulder: Secondary | ICD-10-CM

## 2019-04-03 DIAGNOSIS — M25551 Pain in right hip: Secondary | ICD-10-CM | POA: Diagnosis not present

## 2019-04-03 NOTE — Therapy (Signed)
[facility] Physical Therapy [address] [place], [place], [address] Phone: [phone]   Fax:  [phone]  Physical Therapy Treatment  Patient Details  Name: Blake Mcclure MRN: [id number] Date of Birth: 02/04/1951 Referring Provider (PT): Blackman, MD   Encounter Date: 04/03/2019  PT End of Session - 04/03/19 0905    Visit Number  3    Number of Visits  12    Date for PT Re-Evaluation  05/02/19    Authorization Type  MCR/Tricare    PT Start Time  0847    PT Stop Time  0927    PT Time Calculation (min)  40 min    Activity Tolerance  Patient tolerated treatment well    Behavior During Therapy  W[facility] for tasks assessed/performed       History reviewed. No pertinent past medical history.  History reviewed. No pertinent surgical history.  There were no vitals filed for this visit.  Subjective Assessment - 04/03/19 0849    Subjective  Pt. indicated about 2/10 while getting up in transfers.  Pt. stated 3/10 or so while getting socks/shoes on.    Pertinent History  no significant PMH    Limitations  Standing;Walking;House hold activities    How long can you stand comfortably?  depends    How long can you walk comfortably?  depends    Diagnostic tests  "Mild narrowing of the AC jt otherwise neg shoulder XR. hip XR There is a large calcification bone spur above the acetabular rim potentially indicative of old injury to the hip flexors or quad muscle insertion"    Patient Stated Goals  get the pain down    Pain Score  3     Pain Location  Hip    Pain Orientation  Right    Pain Onset  More than a month ago                       [facility] Adult PT Treatment/Exercise - 04/03/19 0001      Lumbar Exercises: Stretches   Lower Trunk Rotation  5 reps   15 seconds bilaterally   Piriformis Stretch  Right;3 reps;30 seconds      Lumbar Exercises: Aerobic   Nustep  L6 10 mins      Lumbar Exercises: Standing   Other Standing Lumbar Exercises   squat to table touch and stand 3 x 10 10 lb       Lumbar Exercises: Supine   Bridge  Other (comment);20 reps      Knee/Hip Exercises: Stretches   Quad Stretch  Both;3 reps;30 seconds   prone c strap     Manual Therapy   Manual Therapy  Joint mobilization    Joint Mobilization  g3-g4 PA mobs to Rt hip joint, passive quad stretching, MET             PT Education - 04/03/19 0850    Education Details  HEP review, cues for intervention at times.    Person(s) Educated  Patient    Methods  Explanation;Verbal cues    Comprehension  Verbalized understanding          PT Long Term Goals - 03/21/19 0948      PT LONG TERM GOAL #1   Title  Pt will be I and compliant with HEP. (target for all LTG 6 weeks 05/02/19)    Status  New      PT LONG TERM GOAL #2   Title  Pt   will improve lumbar and Lt shoulder AROM to WNL    Status  New      PT LONG TERM GOAL #3   Title  Pt will improve Lt shoulder strength and Rt hip strength to 5/5    Status  New      PT LONG TERM GOAL #4   Title  Pt will report less than 2-3 overall pain with usual activities    Status  New      PT LONG TERM GOAL #5   Title  Pt will have WNL gait pattern for community ambulation    Status  New            Plan - 04/03/19 0902    Clinical Impression Statement  Hip joint passive accessory mobility limited on Rt compared to Lt, specifically in PA mobility assessment.  + ely's and thomas testing bilat, Rt worse than Lt noted for quad and hip flexor tightness as well today    Examination-Activity Limitations  Lift;Stand;Stairs;Squat;Sleep;Carry;Locomotion Level    Examination-Participation Restrictions  Cleaning;Community Activity;Yard Work;Shop    Stability/Clinical Decision Making  Evolving/Moderate complexity    Rehab Potential  Good    PT Frequency  2x / week    PT Duration  6 weeks    PT Treatment/Interventions  ADLs/Self Care Home Management;Cryotherapy;Electrical Stimulation;Iontophoresis 4mg/ml  Dexamethasone;Moist Heat;Traction;Ultrasound;Gait training;Therapeutic activities;Therapeutic exercise;Neuromuscular re-education;Manual techniques;Passive range of motion;Dry needling;Taping;Spinal Manipulations;Joint Manipulations    PT Next Visit Plan  Reassess hip joint mobility, muscle length in affected LE.    PT Home Exercise Plan  [email or web address]    Consulted and Agree with Plan of Care  Patient       Patient will benefit from skilled therapeutic intervention in order to improve the following deficits and impairments:  Abnormal gait, Decreased activity tolerance, Decreased mobility, Decreased range of motion, Difficulty walking, Decreased strength, Increased muscle spasms, Impaired flexibility, Postural dysfunction, Pain  Visit Diagnosis: Acute right-sided low back pain, unspecified whether sciatica present  Pain in right hip  Other abnormalities of gait and mobility  Acute pain of left shoulder     Problem List There are no problems to display for this patient.   Michael Wright, PT, DPT, OCS, ATC 04/03/19  9:21 AM      [facility] [address] [place], [place], [address] Phone: [phone]   Fax:  [phone]  Name: Blake Mcclure MRN: [id number] Date of Birth: 11/30/1950   

## 2019-04-03 NOTE — Patient Instructions (Signed)
Access Code: [email or web address] URL: [email or web address] Date: 04/03/2019 Prepared by: Scot Jun  Exercises Supine Bridge - 10 reps - 3 sets - 2 hold - 2x daily - 7x weekly Supine Lower Trunk Rotation - 5 reps - 1 sets - 15 hold - 2x daily - 7x weekly Clamshell - 10 reps - 3 sets - 2x daily - 7x weekly Standing Shoulder Row with Anchored Resistance - 10 reps - 3 sets - 2x daily - 7x weekly Shoulder Extension with Resistance - 10 reps - 3 sets - 2x daily - 7x weekly Prone Quadriceps Stretch with Strap - 5 reps - 1 sets - 30 hold - 2x daily - 7x weekly Supine Piriformis Stretch - 5 reps - 1 sets - 30 hold - 2x daily - 7x weekly

## 2019-04-05 ENCOUNTER — Encounter: Payer: Self-pay | Admitting: Rehabilitative and Restorative Service Providers"

## 2019-04-05 ENCOUNTER — Other Ambulatory Visit: Payer: Self-pay

## 2019-04-05 ENCOUNTER — Ambulatory Visit (INDEPENDENT_AMBULATORY_CARE_PROVIDER_SITE_OTHER): Payer: Medicare Other | Admitting: Rehabilitative and Restorative Service Providers"

## 2019-04-05 DIAGNOSIS — R2689 Other abnormalities of gait and mobility: Secondary | ICD-10-CM | POA: Diagnosis not present

## 2019-04-05 DIAGNOSIS — M25512 Pain in left shoulder: Secondary | ICD-10-CM

## 2019-04-05 DIAGNOSIS — M25551 Pain in right hip: Secondary | ICD-10-CM | POA: Diagnosis not present

## 2019-04-05 DIAGNOSIS — M545 Low back pain, unspecified: Secondary | ICD-10-CM

## 2019-04-05 NOTE — Therapy (Addendum)
[facility] Physical Therapy [address] [place], [place], [address] Phone: [phone]   Fax:  [phone]  Physical Therapy Treatment/Discharge Note  Patient Details  Name: Blake Mcclure MRN: [id number] Date of Birth: 11/11/1950 Referring Provider (PT): Ninfa Linden, MD   Encounter Date: 04/05/2019  PT End of Session - 04/05/19 0910    Visit Number  4    Number of Visits  12    Date for PT Re-Evaluation  05/02/19    Authorization Type  MCR/Tricare    PT Start Time  0848    PT Stop Time  0928    PT Time Calculation (min)  40 min    Activity Tolerance  Patient tolerated treatment well    Behavior During Therapy  [facility] for tasks assessed/performed       History reviewed. No pertinent past medical history.  History reviewed. No pertinent surgical history.  There were no vitals filed for this visit.  Subjective Assessment - 04/05/19 0852    Subjective  Pt. reported an occasional 2-3/10 since last visit.  Feeling like some improvement has been noted in motion.    Pertinent History  no significant PMH    Limitations  Standing;Walking;House hold activities    How long can you stand comfortably?  depends    How long can you walk comfortably?  depends    Diagnostic tests  "Mild narrowing of the AC jt otherwise neg shoulder XR. hip XR There is a large calcification bone spur above the acetabular rim potentially indicative of old injury to the hip flexors or quad muscle insertion"    Patient Stated Goals  get the pain down    Currently in Pain?  No/denies    Pain Score  0-No pain    Pain Location  Hip    Pain Orientation  Right    Pain Onset  More than a month ago                       [facility] Adult PT Treatment/Exercise - 04/05/19 0001      Lumbar Exercises: Stretches   Piriformis Stretch  Right;5 reps;30 seconds      Lumbar Exercises: Aerobic   Nustep  L6 10 mins      Lumbar Exercises: Standing   Other Standing Lumbar Exercises  squat  to table touch and stand 3 x 15 12 lb       Knee/Hip Exercises: Stretches   Sports administrator  Both;30 seconds;5 reps      Knee/Hip Exercises: Sidelying   Other Sidelying Knee/Hip Exercises  Rt hip ER in R sidelying 3 x 10      Manual Therapy   Manual Therapy  Joint mobilization    Joint Mobilization  g3-g4 PA mobs to Rt hip joint, passive quad stretching, MET             PT Education - 04/05/19 0177    Education Details  Intervention cues.    Person(s) Educated  Patient    Methods  Explanation;Verbal cues    Comprehension  Verbalized understanding;Returned demonstration          PT Long Term Goals - 03/21/19 0948      PT LONG TERM GOAL #1   Title  Pt will be I and compliant with HEP. (target for all LTG 6 weeks 05/02/19)    Status  New      PT LONG TERM GOAL #2   Title  Pt will improve lumbar and  Lt shoulder AROM to WNL    Status  New      PT LONG TERM GOAL #3   Title  Pt will improve Lt shoulder strength and Rt hip strength to 5/5    Status  New      PT LONG TERM GOAL #4   Title  Pt will report less than 2-3 overall pain with usual activities    Status  New      PT LONG TERM GOAL #5   Title  Pt will have WNL gait pattern for community ambulation    Status  New            Plan - 04/05/19 0909    Clinical Impression Statement  Pt. demonstrated marked improvement in FABER, ER mobility of Rt hip upon reassessment today, making progress towards goals and functional movement goals.  Continued HEP and skilled PT indicated to continue progression.    Examination-Activity Limitations  Lift;Stand;Stairs;Squat;Sleep;Carry;Locomotion Level    Examination-Participation Restrictions  Cleaning;Community Activity;Yard Work;Shop    Stability/Clinical Decision Making  Evolving/Moderate complexity    Rehab Potential  Good    PT Frequency  2x / week    PT Duration  6 weeks    PT Treatment/Interventions  ADLs/Self Care Home Management;Cryotherapy;Electrical  Stimulation;Iontophoresis 92m/ml Dexamethasone;Moist Heat;Traction;Ultrasound;Gait training;Therapeutic activities;Therapeutic exercise;Neuromuscular re-education;Manual techniques;Passive range of motion;Dry needling;Taping;Spinal Manipulations;Joint Manipulations    PT Next Visit Plan  Continue to improve FABER movement on Rt hip, strength.    PT Home Exercise Plan  [email or web address]    Consulted and Agree with Plan of Care  Patient       Patient will benefit from skilled therapeutic intervention in order to improve the following deficits and impairments:  Abnormal gait, Decreased activity tolerance, Decreased mobility, Decreased range of motion, Difficulty walking, Decreased strength, Increased muscle spasms, Impaired flexibility, Postural dysfunction, Pain  Visit Diagnosis: Acute right-sided low back pain, unspecified whether sciatica present  Pain in right hip  Other abnormalities of gait and mobility  Acute pain of left shoulder     Problem List There are no problems to display for this patient.   PHYSICAL THERAPY DISCHARGE SUMMARY  Visits from Start of Care: 4  Current functional level related to goals / functional outcomes: See note   Remaining deficits: See note   Education / Equipment: HEP Plan:                                                    Patient goals were partially met. Patient is being discharged due to not returning since the last visit.  ?????       MScot Jun PT, DPT, OCS, ATC 07/03/19  4:22 PM      [facility] [address] [address]Phone: [phone]  Fax:  [phone] Name: Blake QualeMRN: [id number] of Birth: 2Feb 09, 1952

## 2019-04-10 ENCOUNTER — Encounter: Payer: Medicare Other | Admitting: Rehabilitative and Restorative Service Providers"

## 2019-04-11 ENCOUNTER — Other Ambulatory Visit: Payer: Self-pay

## 2019-04-11 ENCOUNTER — Ambulatory Visit (INDEPENDENT_AMBULATORY_CARE_PROVIDER_SITE_OTHER): Payer: Medicare Other | Admitting: Orthopaedic Surgery

## 2019-04-11 ENCOUNTER — Encounter: Payer: Self-pay | Admitting: Orthopaedic Surgery

## 2019-04-11 ENCOUNTER — Ambulatory Visit (INDEPENDENT_AMBULATORY_CARE_PROVIDER_SITE_OTHER): Payer: Medicare Other

## 2019-04-11 DIAGNOSIS — G8929 Other chronic pain: Secondary | ICD-10-CM | POA: Diagnosis not present

## 2019-04-11 DIAGNOSIS — M25512 Pain in left shoulder: Secondary | ICD-10-CM

## 2019-04-11 DIAGNOSIS — M545 Low back pain, unspecified: Secondary | ICD-10-CM

## 2019-04-11 NOTE — Progress Notes (Signed)
Office Visit Note   Patient: Blake Mcclure           Date of Birth: 06-16-1950           MRN: [id number] Visit Date: 04/11/2019              Requested by: Blake Mcclure, Blake Mcclure Blake Mcclure,  Blake Mcclure [address] PCP: Blake Blade, MD   Assessment & Plan: Visit Diagnoses:  1. Chronic low back pain, unspecified back pain laterality, unspecified whether sciatica present   2. Chronic left shoulder pain     Plan: Given patient's continued low back pain despite conservative measures recommend MRI to evaluate for HNP as a source of his right leg radicular symptoms and right buttocks pain.  Also do that it is failed conservative treatment of the left shoulder which is included subacromial injection physical therapy at time recommend MRI to evaluate rotator cuff as source of his pain.  He will follow-up after the MRI to go over results and discuss further treatment.  Questions encouraged and answered.  Suggest he continue with a home exercise program only and hold on formal physical therapy at this point time.  Follow-Up Instructions: No follow-ups on file.   Orders:  Orders Placed This Encounter  Procedures  . XR Lumbar Spine 2-3 Views   No orders of the defined types were placed in this encounter.     Procedures: No procedures performed   Clinical Data: No additional findings.   Subjective: Chief Complaint  Patient presents with  . Left Shoulder - Follow-up  . Right Hip - Follow-up    HPI [name] returns today for his left shoulder and right hip lower back pain.  He states the left shoulder injection did not really help.  Still having pain particularly with overhead activity with the left shoulder and unable to sleep on the left side due to the shoulder pain.  He is having no radicular symptoms down the left arm.  He has gone to physical therapy for both the shoulder and his hip and back.  He feels therapy really has not helped.  Regards to his  hip back pain.  He has numbness in anterior aspect of his right tibia.  He also feels that he has times a "charley horse" right groin pain.  He states at times whenever he is walking he has to grab a chair due to the right hip and low back pain.  He describes it as shooting pain.  He notes he has chronic right buttocks pain.  Some of the physical therapy maneuvers of actually exacerbated the pain in the low back and hip.. Reviewed radiographs of the left shoulder which showed no acute fractures.  Narrowing of the [facility] joint mild.  Otherwise shoulder well located.  AP pelvis and lateral view of the right hip shows hips to be well preserved.  Large osteophyte off the superior acetabular rim.  No acute fractures.  Review of Systems Negative for fevers chills shortness of breath chest pain.  Please see HPI otherwise negative or noncontributory  Objective: Vital Signs: There were no vitals taken for this visit.  Physical Exam Constitutional:      Appearance: He is not ill-appearing or diaphoretic.  Pulmonary:     Effort: Pulmonary effort is normal.  Neurological:     Mental Status: He is alert and oriented to person, place, and time.  Psychiatric:        Mood and Affect: Mood normal.  Ortho Exam Bilateral shoulders he has 5 out of 5 strength with external and internal rotation against resistance.  Empty can test is negative.  Has pain with liftoff test.  But liftoff test is negative.  Left shoulder positive impingement sign.  Abduction of left arm across chest causes no significant pain.  Range of motion of the shoulder though there is some palpable clicking about the [facility] joint.  Nontender with palpation of the shoulder girdle including the AC joint. Specialty Comments:  No specialty comments available.  Imaging: XR Lumbar Spine 2-3 Views  Result Date: 04/11/2019 Lumbar spine 2 views: This space overall well-maintained.  Slight scoliosis.  No acute findings.  Mild anterior osteophytes off the  L3 vertebral body.  Mild to moderate lower facet changes.  Normal lordotic curvature.    PMFS History: There are no problems to display for this patient.  No past medical history on file.  Family History  Problem Relation Age of Onset  . Healthy Mother   . Healthy Father     No past surgical history on file. Social History   Occupational History  . Not on file  Tobacco Use  . Smoking status: Never Smoker  . Smokeless tobacco: Never Used  Substance and Sexual Activity  . Alcohol use: Not Currently  . Drug use: Not on file  . Sexual activity: Not on file

## 2019-04-12 ENCOUNTER — Other Ambulatory Visit: Payer: Self-pay

## 2019-04-12 DIAGNOSIS — M4807 Spinal stenosis, lumbosacral region: Secondary | ICD-10-CM

## 2019-04-12 DIAGNOSIS — Z96612 Presence of left artificial shoulder joint: Secondary | ICD-10-CM

## 2019-04-13 ENCOUNTER — Encounter: Payer: Medicare Other | Admitting: Rehabilitative and Restorative Service Providers"

## 2019-04-17 ENCOUNTER — Encounter: Payer: Medicare Other | Admitting: Rehabilitative and Restorative Service Providers"

## 2019-04-19 ENCOUNTER — Encounter: Payer: Medicare Other | Admitting: Rehabilitative and Restorative Service Providers"

## 2019-04-24 ENCOUNTER — Encounter: Payer: Medicare Other | Admitting: Rehabilitative and Restorative Service Providers"

## 2019-04-26 ENCOUNTER — Encounter: Payer: Medicare Other | Admitting: Rehabilitative and Restorative Service Providers"

## 2019-05-12 ENCOUNTER — Ambulatory Visit
Admission: RE | Admit: 2019-05-12 | Discharge: 2019-05-12 | Disposition: A | Discharge status: Home / Self Care | Payer: Medicare Other | Source: Ambulatory Visit | Attending: Orthopaedic Surgery | Admitting: Orthopaedic Surgery

## 2019-05-12 ENCOUNTER — Other Ambulatory Visit: Payer: Self-pay

## 2019-05-12 DIAGNOSIS — M4807 Spinal stenosis, lumbosacral region: Secondary | ICD-10-CM

## 2019-05-12 DIAGNOSIS — Z96612 Presence of left artificial shoulder joint: Secondary | ICD-10-CM

## 2019-05-15 ENCOUNTER — Other Ambulatory Visit: Payer: Self-pay

## 2019-05-15 ENCOUNTER — Other Ambulatory Visit: Payer: Self-pay | Admitting: Radiology

## 2019-05-15 ENCOUNTER — Encounter: Payer: Self-pay | Admitting: Orthopaedic Surgery

## 2019-05-15 ENCOUNTER — Ambulatory Visit (INDEPENDENT_AMBULATORY_CARE_PROVIDER_SITE_OTHER): Payer: Medicare Other | Admitting: Orthopaedic Surgery

## 2019-05-15 DIAGNOSIS — M7542 Impingement syndrome of left shoulder: Secondary | ICD-10-CM

## 2019-05-15 DIAGNOSIS — M4807 Spinal stenosis, lumbosacral region: Secondary | ICD-10-CM

## 2019-05-15 NOTE — Progress Notes (Signed)
Office Visit Note   Patient: Blake Mcclure           Date of Birth: March 31, 1950           MRN: [id number] Visit Date: 05/15/2019              Requested by: Willey Blade, [place],  [place] [address] PCP: Willey Blade, MD   Assessment & Plan: Visit Diagnoses:  1. Spinal stenosis of lumbosacral region   2. Impingement syndrome of left shoulder     Plan:  Recommend epidural steroid injection lumbar spine due to patient's continued radicular symptoms down the right leg and given his stenosis seen on MRI.  Will hold off on the lumbar epidural steroid injection until 2 weeks after his labs Covid vaccine injection which is scheduled for April 9.  In regards to his left shoulder x-rays failed conservative treatment which is included time, subacromial injection and physical therapy.  Given his findings of type II acromion, rotator cuff tendinopathy without full thickness tear or retraction recommend left shoulder arthroscopy with extensive debridement and subacromial decompression.  Did discuss with him that he has moderate glenohumeral joint degenerative changes and this would not cure or fix the arthritis in his shoulder.  Risk benefits surgery discussed with patient at length.  Questions were encouraged and answered by Dr. Ninfa Linden and myself.  He will follow up with [place] 1 week postop.  Follow-Up Instructions: Return 1 week postop.   Orders:  No orders of the defined types were placed in this encounter.  No orders of the defined types were placed in this encounter.     Procedures: No procedures performed   Clinical Data: No additional findings.   Subjective: Chief Complaint  Patient presents with  . Left Shoulder - Follow-up  . Lower Back - Follow-up    HPI Mr. Renfro returns today to go over the MRI of his lumbar spine and his left shoulder.  He states his symptoms for the spine and right radicular pain into the lower leg is unchanged.   His shoulder also is unchanged in symptoms. MRI of the lumbar spine is reviewed with the patient.  MRI dated 05/13/2019 showed foraminal stenosis which was moderate at L3-4 severe right and moderate left foraminal stenosis at L5-S1 and mild spinal stenosis at this level.  L5-S1 anterior spondylolisthesis grade 1 severe facet arthritis at this level.  Narrowing of the lateral recesses which results in severe bilateral neural foraminal stenosis.  Model of the lumbar spine was used for visualization purposes to reviewed the findings with the patient. MRI of left shoulder dated 05/13/2019 showed no acute rotator cuff tear.  There is no evidence of retraction.  There is some interstitial tearing with moderate rotator cuff tendinopathy.  Moderate glenohumeral joint changes.  Type II acromion.  Mild to moderate AC joint degenerative changes.  Review of Systems Negative for fevers chills shortness of breath chest pain  Objective: Vital Signs: There were no vitals taken for this visit.  Physical Exam Constitutional:      Appearance: He is normal weight. He is not ill-appearing or diaphoretic.  Pulmonary:     Effort: Pulmonary effort is normal.  Neurological:     Mental Status: He is alert and oriented to person, place, and time.  Psychiatric:        Mood and Affect: Mood normal.     Ortho Exam  Specialty Comments:  No specialty comments available.  Imaging: No  results found.   PMFS History: There are no problems to display for this patient.  No past medical history on file.  Family History  Problem Relation Age of Onset  . Healthy Mother   . Healthy Father     No past surgical history on file. Social History   Occupational History  . Not on file  Tobacco Use  . Smoking status: Never Smoker  . Smokeless tobacco: Never Used  Substance and Sexual Activity  . Alcohol use: Not Currently  . Drug use: Not on file  . Sexual activity: Not on file

## 2019-05-22 ENCOUNTER — Telehealth: Payer: Self-pay

## 2019-05-22 NOTE — Telephone Encounter (Signed)
Patient aware note is at front desk

## 2019-05-22 NOTE — Telephone Encounter (Signed)
Patient is having shoulder surgery on 05/31/19.  He wants to know how long he will be out of work.  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  He will need a work note.  Please call.  [phone]

## 2019-05-22 NOTE — Telephone Encounter (Signed)
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  However he cannot do any lifting of anything heavy or overhead activities for at least 6 weeks after surgery.

## 2019-05-22 NOTE — Telephone Encounter (Signed)
Please advise your opinion

## 2019-05-31 ENCOUNTER — Other Ambulatory Visit: Payer: Self-pay | Admitting: Orthopaedic Surgery

## 2019-05-31 DIAGNOSIS — M7542 Impingement syndrome of left shoulder: Secondary | ICD-10-CM

## 2019-05-31 DIAGNOSIS — M65812 Other synovitis and tenosynovitis, left shoulder: Secondary | ICD-10-CM | POA: Diagnosis not present

## 2019-05-31 DIAGNOSIS — M19012 Primary osteoarthritis, left shoulder: Secondary | ICD-10-CM | POA: Diagnosis not present

## 2019-05-31 MED ORDER — HYDROCODONE-ACETAMINOPHEN 5-325 MG PO TABS: 1.0000 | ORAL_TABLET | ORAL | 0 refills | Status: DC | PRN

## 2019-06-06 ENCOUNTER — Ambulatory Visit (INDEPENDENT_AMBULATORY_CARE_PROVIDER_SITE_OTHER): Payer: Medicare Other | Admitting: Physical Medicine and Rehabilitation

## 2019-06-06 ENCOUNTER — Other Ambulatory Visit: Payer: Self-pay

## 2019-06-06 ENCOUNTER — Ambulatory Visit: Payer: Self-pay

## 2019-06-06 ENCOUNTER — Encounter: Payer: Self-pay | Admitting: Physical Medicine and Rehabilitation

## 2019-06-06 VITALS — BP 146/79 | HR 67

## 2019-06-06 DIAGNOSIS — M5416 Radiculopathy, lumbar region: Secondary | ICD-10-CM

## 2019-06-06 MED ORDER — METHYLPREDNISOLONE ACETATE 80 MG/ML IJ SUSP: 40.0000 mg | Freq: Once | INTRAMUSCULAR | Status: DC

## 2019-06-06 NOTE — Progress Notes (Signed)
Patient states pain is in the right side of lower back with no leg pain. Patient states nothing makes pain better. Patient states sitting and bending over increase pain.   Numeric Pain Rating Scale and Functional Assessment Average Pain (5)   In the last MONTH (on 0-10 scale) has pain interfered with the following?  1. General activity like being  able to carry out your everyday physical activities such as walking, climbing stairs, carrying groceries, or moving a chair?  Rating(0)   +Driver, -BT, -Dye Allergies.

## 2019-06-07 ENCOUNTER — Ambulatory Visit (INDEPENDENT_AMBULATORY_CARE_PROVIDER_SITE_OTHER): Payer: Medicare Other | Admitting: Physician Assistant

## 2019-06-07 ENCOUNTER — Encounter: Payer: Self-pay | Admitting: Physician Assistant

## 2019-06-07 DIAGNOSIS — Z9889 Other specified postprocedural states: Secondary | ICD-10-CM

## 2019-06-07 NOTE — Progress Notes (Signed)
HPI: Blake Mcclure returns today 1 week status post left shoulder arthroscopy with extensive debridement and subacromial decompression without Norco acromial release.  He was found to have some moderate arthritic changes of his glenohumeral joint.  He denies any chest pain shortness of breath fever chills.  He discontinued his sling couple of days ago.  He is having pain with overhead motion.  Physical exam: Left shoulder port sites all well approximated with sutures no signs of infection.  He has good range of motion the elbow wrist and hand.  Forward flexion actively to 90 degrees.  Impression: Status post left shoulder arthroscopy and symptoms treatment and  SAD  Plan: He will work on wall crawls, pendulum, Codman and forward flexion exercises as shown today.  Sutures removed.  Work on TEFL teacher.  Follow-up with [place] in 4 weeks as requested concerns.

## 2019-06-18 ENCOUNTER — Ambulatory Visit (INDEPENDENT_AMBULATORY_CARE_PROVIDER_SITE_OTHER): Payer: Medicare Other | Admitting: Physician Assistant

## 2019-06-18 ENCOUNTER — Encounter: Payer: Self-pay | Admitting: Physician Assistant

## 2019-06-18 ENCOUNTER — Other Ambulatory Visit: Payer: Self-pay

## 2019-06-18 DIAGNOSIS — M7061 Trochanteric bursitis, right hip: Secondary | ICD-10-CM

## 2019-06-18 MED ORDER — METHYLPREDNISOLONE ACETATE 40 MG/ML IJ SUSP
40.0000 mg | INTRAMUSCULAR | Status: AC | PRN
  Administered 2019-06-18: 40 mg via INTRA_ARTICULAR

## 2019-06-18 MED ORDER — LIDOCAINE HCL 1 % IJ SOLN
3.0000 mL | INTRAMUSCULAR | Status: AC | PRN
  Administered 2019-06-18: 3 mL

## 2019-06-18 NOTE — Progress Notes (Signed)
Office Visit Note   Patient: Blake Mcclure           Date of Birth: 04-29-1950           MRN: [id number] Visit Date: 06/18/2019              Requested by: Willey Blade, [place],  [place] [address] PCP: Willey Blade, MD   Assessment & Plan: Visit Diagnoses:  1. Trochanteric bursitis, right hip     Plan: He shown IT band stretching exercises.  We will see him back in 2 weeks to see what type of response he had to the injection and had the exercises.  Questions were encouraged and answered.  Follow-Up Instructions: No follow-ups on file.   Orders:  No orders of the defined types were placed in this encounter.  No orders of the defined types were placed in this encounter.     Procedures: Large Joint Inj: R greater trochanter on 06/18/2019 11:46 AM Indications: pain Details: 22 G 1.5 in needle, lateral approach  Arthrogram: No  Medications: 3 mL lidocaine 1 %; 40 mg methylPREDNISolone acetate 40 MG/ML Outcome: tolerated well, no immediate complications Procedure, treatment alternatives, risks and benefits explained, specific risks discussed. Consent was given by the patient. Immediately prior to procedure a time out was called to verify the correct patient, procedure, equipment, support staff and site/side marked as required. Patient was prepped and draped in the usual sterile fashion.       Clinical Data: No additional findings.   Subjective: Chief Complaint  Patient presents with  . Right Hip - Pain    HPI Blake Mcclure comes in today due to right hip pain.  He states after having lumbar epidural steroid injection by Dr. Ernestina Patches on 428 it is pain in the back is better.  His only complaint of lateral right hip pain.  States is very achy.  Worse when putting on socks or squatting.  Has had no injury.  He has had no fevers chills.  Prior x-rays of the right hip including AP pelvis showed both hips to be well located and  well-maintained pain hip joints.  Large calcification above the acetabular rim and was seen on prior films consistent with possible prior injury. He denies any radicular symptoms down the right leg.  No groin pain.  Pain is lateral aspect of the right hip.  Does not bother him when he relies on the hip at night. Review of Systems See HPI  Objective: Vital Signs: There were no vitals taken for this visit.  Physical Exam Constitutional:      Appearance: He is normal weight. He is not ill-appearing or diaphoretic.  Pulmonary:     Effort: Pulmonary effort is normal.  Neurological:     Mental Status: He is alert and oriented to person, place, and time.  Psychiatric:        Mood and Affect: Mood normal.     Ortho Exam   Bilateral hips excellent range of motion without pain.  Right hip tenderness over the right trochanteric region.  No erythema swelling or skin changes of the right hip. Specialty Comments:  No specialty comments available.  Imaging: No results found.   PMFS History: There are no problems to display for this patient.  History reviewed. No pertinent past medical history.  Family History  Problem Relation Age of Onset  . Healthy Mother   . Healthy Father     History reviewed. No  pertinent surgical history. Social History   Occupational History  . Not on file  Tobacco Use  . Smoking status: Never Smoker  . Smokeless tobacco: Never Used  Substance and Sexual Activity  . Alcohol use: Not Currently  . Drug use: Not on file  . Sexual activity: Not on file

## 2019-07-05 ENCOUNTER — Ambulatory Visit (INDEPENDENT_AMBULATORY_CARE_PROVIDER_SITE_OTHER): Payer: Medicare Other | Admitting: Physician Assistant

## 2019-07-05 ENCOUNTER — Encounter: Payer: Self-pay | Admitting: Physician Assistant

## 2019-07-05 ENCOUNTER — Other Ambulatory Visit: Payer: Self-pay

## 2019-07-05 DIAGNOSIS — M7061 Trochanteric bursitis, right hip: Secondary | ICD-10-CM

## 2019-07-05 NOTE — Progress Notes (Signed)
HPI: Blake Mcclure returns today follow-up status post right greater trochanteric injection 06/18/2019. States the injection was very helpful. He is ready go back to work. He has no pain lateral aspect of his right hip.  Review of systems: No fevers chills. Please see HPI  Physical exam: Right hip excellent range of motion without pain. No tenderness over the trochanteric region. Ambulates without any assistive device.  Impression: Right hip trochanteric bursitis  Plan: We'll have him return to work on 07/09/2019 per his wishes with the only restriction of no lifting greater than 20 pounds. He'll follow-up with [place] on as-needed basis. Questions were encouraged and answered

## 2019-07-27 NOTE — Progress Notes (Signed)
Blake Mcclure - 69 y.o. male MRN [id number]  Date of birth: 1950-05-24  Office Visit Note: Visit Date: 06/06/2019 PCP: Willey Blade, MD Referred by: Willey Blade, MD  Subjective: Chief Complaint  Patient presents with  . Lower Back - Pain   HPI:  Blake Mcclure is a 69 y.o. male who comes in today at the request of Benita Stabile, P.A.-C for planned Right L5-S1 Lumbar epidural steroid injection with fluoroscopic guidance.  The patient has failed conservative care including home exercise, medications, time and activity modification.  This injection will be diagnostic and hopefully therapeutic.  Please see requesting physician notes for further details and justification.   MRI reviewed with images and spine model.  MRI reviewed in the note below.   ROS Otherwise per HPI.  Assessment & Plan: Visit Diagnoses:  1. Lumbar radiculopathy     Plan: No additional findings.   Meds & Orders:  Meds ordered this encounter  Medications  . methylPREDNISolone acetate (DEPO-MEDROL) injection 40 mg    Orders Placed This Encounter  Procedures  . XR C-ARM NO REPORT  . Epidural Steroid injection    Follow-up: Return for visit to requesting physician as needed.   Procedures: No procedures performed  Lumbar Epidural Steroid Injection - Interlaminar Approach with Fluoroscopic Guidance  Patient: Blake Mcclure      Date of Birth: [birth year] MRN: [id number] PCP: Willey Blade, MD      Visit Date: 06/06/2019   Universal Protocol:     Consent Given By: the patient  Position: PRONE  Additional Comments: Vital signs were monitored before and after the procedure. Patient was prepped and draped in the usual sterile fashion. The correct patient, procedure, and site was verified.   Injection Procedure Details:  Procedure Site One Meds Administered:  Meds ordered this encounter  Medications  . methylPREDNISolone acetate (DEPO-MEDROL) injection 40 mg      Laterality: Right  Location/Site:  L5-S1  Needle size: 20 G  Needle type: Tuohy  Needle Placement: Paramedian epidural  Findings:   -Comments: Excellent flow of contrast into the epidural space.  Procedure Details: Using a paramedian approach from the side mentioned above, the region overlying the inferior lamina was localized under fluoroscopic visualization and the soft tissues overlying this structure were infiltrated with 4 ml. of 1% Lidocaine without Epinephrine. The Tuohy needle was inserted into the epidural space using a paramedian approach.   The epidural space was localized using loss of resistance along with lateral and bi-planar fluoroscopic views.  After negative aspirate for air, blood, and CSF, a 2 ml. volume of Isovue-250 was injected into the epidural space and the flow of contrast was observed. Radiographs were obtained for documentation purposes.    The injectate was administered into the level noted above.   Additional Comments:  The patient tolerated the procedure well Dressing: 2 x 2 sterile gauze and Band-Aid    Post-procedure details: Patient was observed during the procedure. Post-procedure instructions were reviewed.  Patient left the clinic in stable condition.     Clinical History: MRI LUMBAR SPINE WITHOUT CONTRAST  TECHNIQUE: Multiplanar, multisequence MR imaging of the lumbar spine was performed. No intravenous contrast was administered.  COMPARISON:  None.  FINDINGS: Segmentation:  Standard  Alignment:  Grade 1 anterolisthesis at L5-S1  Vertebrae:  No fracture, evidence of discitis, or bone lesion.  Conus medullaris and cauda equina: Conus extends to the L2 level. Conus and cauda equina appear normal.  Paraspinal and other soft tissues:  Negative  Disc levels:  T12-L1: Normal disc space and facet joints. There is no spinal canal stenosis. No neural foraminal stenosis.  L1-L2: Normal disc space and facet joints.  There is no spinal canal stenosis. No neural foraminal stenosis.  L2-L3: Normal disc space and facet joints. There is no spinal canal stenosis. No neural foraminal stenosis.  L3-L4: Diffuse disc bulge with mild facet hypertrophy. There is no spinal canal stenosis. Moderate left neural foraminal stenosis.  L4-L5: Diffuse disc bulge. Mild facet hypertrophy. Mild spinal canal stenosis. Severe right and moderate left neural foraminal stenosis.  L5-S1: Severe facet hypertrophy with grade 1 anterolisthesis. Disc uncovering. Narrowing of both lateral recesses without central spinal canal stenosis. Severe bilateral neural foraminal stenosis.  Visualized sacrum: Normal.  IMPRESSION: 1. L5-S1 grade 1 anterolisthesis secondary to severe facet arthrosis. There is narrowing of both lateral recesses and severe bilateral neural foraminal stenosis. 2. L4-L5 mild spinal canal stenosis with severe right and moderate left neural foraminal stenosis. 3. L3-L4 moderate left neural foraminal stenosis.   Electronically Signed   By: Ulyses Jarred M.D.   On: 05/13/2019 04:46     Objective:  VS:  HT:    WT:   BMI:     BP:(!) 146/79  HR:67bpm  TEMP: ( )  RESP:  Physical Exam Constitutional:      General: He is not in acute distress.    Appearance: Normal appearance. He is not ill-appearing.  HENT:     Head: Normocephalic and atraumatic.     Right Ear: External ear normal.     Left Ear: External ear normal.  Eyes:     Extraocular Movements: Extraocular movements intact.  Cardiovascular:     Rate and Rhythm: Normal rate.     Pulses: Normal pulses.  Abdominal:     General: There is no distension.     Palpations: Abdomen is soft.  Musculoskeletal:        General: No tenderness or signs of injury.     Right lower leg: No edema.     Left lower leg: No edema.     Comments: Patient has good distal strength without clonus.  Skin:    Findings: No erythema or rash.  Neurological:      General: No focal deficit present.     Mental Status: He is alert and oriented to person, place, and time.     Sensory: No sensory deficit.     Motor: No weakness or abnormal muscle tone.     Coordination: Coordination normal.  Psychiatric:        Mood and Affect: Mood normal.        Behavior: Behavior normal.      Imaging: No results found.

## 2019-07-27 NOTE — Procedures (Signed)
Lumbar Epidural Steroid Injection - Interlaminar Approach with Fluoroscopic Guidance  Patient: Blake Mcclure      Date of Birth: Jun 08, 1950 MRN: [id number] PCP: Willey Blade, MD      Visit Date: 06/06/2019   Universal Protocol:     Consent Given By: the patient  Position: PRONE  Additional Comments: Vital signs were monitored before and after the procedure. Patient was prepped and draped in the usual sterile fashion. The correct patient, procedure, and site was verified.   Injection Procedure Details:  Procedure Site One Meds Administered:  Meds ordered this encounter  Medications  . methylPREDNISolone acetate (DEPO-MEDROL) injection 40 mg     Laterality: Right  Location/Site:  L5-S1  Needle size: 20 G  Needle type: Tuohy  Needle Placement: Paramedian epidural  Findings:   -Comments: Excellent flow of contrast into the epidural space.  Procedure Details: Using a paramedian approach from the side mentioned above, the region overlying the inferior lamina was localized under fluoroscopic visualization and the soft tissues overlying this structure were infiltrated with 4 ml. of 1% Lidocaine without Epinephrine. The Tuohy needle was inserted into the epidural space using a paramedian approach.   The epidural space was localized using loss of resistance along with lateral and bi-planar fluoroscopic views.  After negative aspirate for air, blood, and CSF, a 2 ml. volume of Isovue-250 was injected into the epidural space and the flow of contrast was observed. Radiographs were obtained for documentation purposes.    The injectate was administered into the level noted above.   Additional Comments:  The patient tolerated the procedure well Dressing: 2 x 2 sterile gauze and Band-Aid    Post-procedure details: Patient was observed during the procedure. Post-procedure instructions were reviewed.  Patient left the clinic in stable condition.

## 2019-07-30 ENCOUNTER — Other Ambulatory Visit: Payer: Self-pay

## 2019-07-30 ENCOUNTER — Ambulatory Visit (INDEPENDENT_AMBULATORY_CARE_PROVIDER_SITE_OTHER): Payer: Medicare Other | Admitting: Physician Assistant

## 2019-07-30 ENCOUNTER — Encounter: Payer: Self-pay | Admitting: Physician Assistant

## 2019-07-30 DIAGNOSIS — M25512 Pain in left shoulder: Secondary | ICD-10-CM

## 2019-07-30 MED ORDER — LIDOCAINE HCL 1 % IJ SOLN
3.0000 mL | INTRAMUSCULAR | Status: AC | PRN
  Administered 2019-07-30: 3 mL

## 2019-07-30 MED ORDER — METHYLPREDNISOLONE ACETATE 40 MG/ML IJ SUSP
40.0000 mg | INTRAMUSCULAR | Status: AC | PRN
  Administered 2019-07-30: 40 mg via INTRA_ARTICULAR

## 2019-07-30 NOTE — Progress Notes (Signed)
   Office Visit Note   Patient: Blake Mcclure           Date of Birth: Apr 29, 1950           MRN: [id number] Visit Date: 07/30/2019              Requested by: Willey Blade, [place] [address] [place],  [place] [address] PCP: Willey Blade, MD   Assessment & Plan: Visit Diagnoses:  1. Left shoulder pain, unspecified chronicity     Plan: We will see him back in 1 month to see what type of response he had to the injection.  He will continue doing his shoulder exercises as he has been shown in the past.  These were reviewed with him.  Questions encouraged and answered.  He may benefit from an intra-articular injection of the shoulder in the future.  Follow-Up Instructions: Return in about 2 months (around 09/29/2019), or Dr. Ninfa Linden.   Orders:  Orders Placed This Encounter  Procedures  . Large Joint Inj: L subacromial bursa   No orders of the defined types were placed in this encounter.     Procedures: Large Joint Inj: L subacromial bursa on 07/30/2019 5:32 PM Indications: pain Details: 22 G 1.5 in needle, superior approach  Arthrogram: No  Medications: 3 mL lidocaine 1 %; 40 mg methylPREDNISolone acetate 40 MG/ML Outcome: tolerated well, no immediate complications Procedure, treatment alternatives, risks and benefits explained, specific risks discussed. Consent was given by the patient. Immediately prior to procedure a time out was called to verify the correct patient, procedure, equipment, support staff and site/side marked as required. Patient was prepped and draped in the usual sterile fashion.       Clinical Data: No additional findings.   Subjective: Chief Complaint  Patient presents with  . Left Shoulder - Follow-up    HPI Blake Mcclure returns today status post left shoulder arthroscopy with extensive debridement and subacromial decompression without coracoid acromial release in April of this year.  He was found to have moderate arthritic  changes of his glenohumeral joint.  He has continued to do exercises for shoulder.  Review of Systems See HPI.  Objective: Vital Signs: There were no vitals taken for this visit.  Physical Exam General: Well-developed well-nourished male no acute distress Ortho Exam Left shoulder port sites are well-healed no signs of infection.  Is full overhead activity.  Impingement testing positive.  Specialty Comments:  No specialty comments available.  Imaging: No results found.   PMFS History: There are no problems to display for this patient.  History reviewed. No pertinent past medical history.  Family History  Problem Relation Age of Onset  . Healthy Mother   . Healthy Father     History reviewed. No pertinent surgical history. Social History   Occupational History  . Not on file  Tobacco Use  . Smoking status: Never Smoker  . Smokeless tobacco: Never Used  Vaping Use  . Vaping Use: Never used  Substance and Sexual Activity  . Alcohol use: Not Currently  . Drug use: Not on file  . Sexual activity: Not on file

## 2019-08-29 ENCOUNTER — Telehealth: Payer: Self-pay | Admitting: Physician Assistant

## 2019-08-29 NOTE — Telephone Encounter (Signed)
Patient advised note written and faxed

## 2019-08-29 NOTE — Telephone Encounter (Signed)
Ok for this? 

## 2019-08-29 NOTE — Telephone Encounter (Signed)
Patient called asked if a letter can be faxed to his employer stating he can  work without any restrictions. The fax # is [phone]  Attn: Dulcy Fanny    The number to contact is [phone]

## 2019-08-29 NOTE — Telephone Encounter (Signed)
Yes that is fine

## 2019-09-06 ENCOUNTER — Ambulatory Visit (INDEPENDENT_AMBULATORY_CARE_PROVIDER_SITE_OTHER): Payer: Medicare Other | Admitting: Orthopaedic Surgery

## 2019-09-06 DIAGNOSIS — Z9889 Other specified postprocedural states: Secondary | ICD-10-CM

## 2019-09-06 NOTE — Addendum Note (Signed)
Addended byLaurann Montana on: 09/06/2019 04:21 PM   Modules accepted: Orders

## 2019-09-06 NOTE — Progress Notes (Signed)
The patient is now 3 months status post a left shoulder arthroscopy with subacromial decompression.  We had debrided significant tendinosis of the rotator cuff.  The rotator cuff itself is intact.  There was some synovitis in the glenohumeral joint as well.  He still having constant pain in that shoulder.  A subacromial injection recently did not really help.  On exam he still has problems with internal rotation and adduction of the left shoulder with reaching behind him.  He still is sore with a crossarm test and overhead activities.  He is using his rotator cuff to abduct his shoulder and I do not feel any weakness in the cuff itself.  I would like to send him for an intra-articular steroid injection under ultrasound in the left shoulder joint by Dr. Junius Roads.  We will also set him up for outpatient physical therapy to hopefully decrease his shoulder pain and improve the mobility of that left shoulder.  This can be done here at [facility].  Once he sees Dr. Junius Roads I would like him to get an appointment with me about 3 to 4 weeks after that intra-articular steroid injection.  We did go over his arthroscopy pictures again to some extent of the surgery we did.  He is a very active and young appearing 69 year old gentleman.  All questions and concerns were answered and addressed.

## 2019-09-12 ENCOUNTER — Other Ambulatory Visit: Payer: Self-pay

## 2019-09-12 ENCOUNTER — Ambulatory Visit (INDEPENDENT_AMBULATORY_CARE_PROVIDER_SITE_OTHER): Payer: Medicare Other | Admitting: Family Medicine

## 2019-09-12 ENCOUNTER — Ambulatory Visit: Payer: Self-pay

## 2019-09-12 ENCOUNTER — Encounter: Payer: Self-pay | Admitting: Family Medicine

## 2019-09-12 DIAGNOSIS — M7542 Impingement syndrome of left shoulder: Secondary | ICD-10-CM

## 2019-09-12 DIAGNOSIS — Z9889 Other specified postprocedural states: Secondary | ICD-10-CM

## 2019-09-12 DIAGNOSIS — M25512 Pain in left shoulder: Secondary | ICD-10-CM

## 2019-09-12 NOTE — Progress Notes (Signed)
Subjective: Patient is here for ultrasound-guided intra-articular left glenohumeral injection.  Ongoing pain s/p decompression.  Objective:  Full ROM today.  Procedure: Ultrasound-guided left glenohumeral injection: After sterile prep with Betadine, injected 8 cc 1% lidocaine without epinephrine and 40 mg methylprednisolone using a 22-gauge spinal needle, passing the needle from posterior approach into the glenohumeral joint.  Injectate seen filling joint capsule.

## 2019-09-17 ENCOUNTER — Telehealth: Payer: Self-pay | Admitting: Physical Medicine and Rehabilitation

## 2019-09-17 DIAGNOSIS — M25512 Pain in left shoulder: Secondary | ICD-10-CM

## 2019-09-17 DIAGNOSIS — M7542 Impingement syndrome of left shoulder: Secondary | ICD-10-CM

## 2019-09-17 NOTE — Telephone Encounter (Signed)
Nothing else that I can think of doing for him other than therapy.  He is likely heading the surgery route.

## 2019-09-17 NOTE — Telephone Encounter (Signed)
Should this go to [facility], or can you advise on this?

## 2019-09-17 NOTE — Telephone Encounter (Signed)
Chris,  Apparently he hasn't gotten much relief from [facility] injection.  Anything specific you would like for him to do?  I think he's going to start PT as well.

## 2019-09-17 NOTE — Telephone Encounter (Signed)
Patient saw Dr. Junius Roads last week for a shoulder injection and states that his shoulder still hurts. Please advise.

## 2019-09-17 NOTE — Telephone Encounter (Signed)
Discussed with Dr. Ninfa Linden and I've placed orders for PT here at our office.

## 2019-09-17 NOTE — Telephone Encounter (Signed)
Patient called advised he is still in pain and was told to call if the pain was still there. The number to contact patient is [phone]

## 2019-09-18 NOTE — Telephone Encounter (Signed)
I called and advised the patient of the plan. He already has been called about PT - coming in tomorrow morning for this.

## 2019-09-19 ENCOUNTER — Ambulatory Visit (INDEPENDENT_AMBULATORY_CARE_PROVIDER_SITE_OTHER): Payer: Medicare Other | Admitting: Rehabilitative and Restorative Service Providers"

## 2019-09-19 ENCOUNTER — Other Ambulatory Visit: Payer: Self-pay

## 2019-09-19 ENCOUNTER — Encounter: Payer: Self-pay | Admitting: Rehabilitative and Restorative Service Providers"

## 2019-09-19 DIAGNOSIS — M6281 Muscle weakness (generalized): Secondary | ICD-10-CM | POA: Diagnosis not present

## 2019-09-19 DIAGNOSIS — M25612 Stiffness of left shoulder, not elsewhere classified: Secondary | ICD-10-CM

## 2019-09-19 DIAGNOSIS — M25512 Pain in left shoulder: Secondary | ICD-10-CM

## 2019-09-19 NOTE — Therapy (Addendum)
[place], [place], [address] Phone: [phone]   Fax:  [phone]  Physical Therapy Evaluation  Patient Details  Name: Blake Mcclure MRN: [id number] Date of Birth: 24-Jul-1950 Referring Provider (PT): Dr. Ninfa Linden   Encounter Date: 09/19/2019   PT End of Session - 09/19/19 0846    Visit Number 1    Number of Visits 24    Date for PT Re-Evaluation 12/12/19    Authorization Type Medicare    Authorization Time Period 09/19/2019- 12/12/2019    Progress Note Due on Visit 10    PT Start Time 0845    PT Stop Time 0925    PT Time Calculation (min) 40 min    Activity Tolerance Patient tolerated treatment well    Behavior During Therapy [facility] for tasks assessed/performed           History reviewed. No pertinent past medical history.  History reviewed. No pertinent surgical history.  There were no vitals filed for this visit.    Subjective Assessment - 09/19/19 0850    Subjective Pt. indicated procedure in April, 2021 with injections performed in Lt shoulder with continued pain complaints.  Pt. stated pain was initially after work but now hurting some all day.    Limitations Lifting    Patient Stated Goals Reduce pain    Currently in Pain? Yes    Pain Score 3    at worst 6/10   Pain Location Shoulder    Pain Orientation Left;Upper;Lateral    Pain Descriptors / Indicators Aching;Throbbing    Pain Type Surgical pain    Pain Frequency Constant    Aggravating Factors  indisious achy, difficult sleeping, limiting in reaching behind back    Pain Relieving Factors Injections STG    Effect of Pain on Daily Activities Difficulty getting dressing, sleeping.              [facility] PT Assessment - 09/19/19 0001      Assessment   Medical Diagnosis S/P Lt shoulder subacromial decompression    Referring Provider (PT) Dr. Ninfa Linden    Onset Date/Surgical Date 05/31/19    Hand Dominance Left    Prior Therapy Therapy for  back recently       Precautions   Precautions None      Restrictions   Weight Bearing Restrictions No      Balance Screen   Has the patient fallen in the past 6 months No    Is the patient reluctant to leave their home because of a fear of falling?  No      Home Ec[employer] residence      Prior Function   Level of Independence Independent    Vocation Full time employment    Pharmacist, hospital    Leisure yard work      Cognition   Overall Cognitive Status Within Functional Limits for tasks assessed      Sensation   Light Touch Appears Intact      ROM / Strength   AROM / PROM / Strength Strength;PROM;AROM      AROM   Overall AROM Comments HBB Rt T9, HBB to iliac crest Lt c pain.  Flexion, abd, er Lt GH jt WFL c mild symptoms at end range    AROM Assessment Site Shoulder    Right/Left Shoulder Left;Right    Left Shoulder Internal Rotation 52 Degrees   pain noted, measured in supine in scaption  PROM   PROM Assessment Site Shoulder    Right/Left Shoulder Left    Left Shoulder Internal Rotation 55 Degrees   pain noted, measured in supine in scaption     Strength   Strength Assessment Site Elbow;Shoulder    Right/Left Shoulder Left;Right    Right Shoulder Flexion 5/5    Right Shoulder ABduction 5/5    Right Shoulder Internal Rotation 5/5    Right Shoulder External Rotation 5/5    Left Shoulder Flexion 4+/5    Left Shoulder ABduction 4+/5    Left Shoulder Internal Rotation 5/5    Left Shoulder External Rotation 5/5    Right/Left Elbow Left;Right    Right Elbow Flexion 5/5    Right Elbow Extension 5/5    Left Elbow Flexion 5/5    Left Elbow Extension 5/5      Palpation   Palpation comment Lt infraspinatus TrP noted c concordant pain      Special Tests   Other special tests GH jt accessory movement WFL inferior, ap in various degrees of movement Lt                      Objective measurements  completed on examination: See above findings.       [place] Adult PT Treatment/Exercise - 09/19/19 0001      Self-Care   Self-Care Other Self-Care Comments    Other Self-Care Comments  DN after care with instructions for HEP performance, movement, heat/ice routine      Exercises   Exercises Other Exercises    Other Exercises  HEP consisting of IR rope stretch, sleeper stretch 30 sec x 5 bilateral, cues for intervention/handout      Manual Therapy   Manual therapy comments g3 inferior jt mobs, prom, compression to lt infraspinatus                  PT Education - 09/19/19 0846    Education Details HEP, POC    Person(s) Educated Patient    Methods Explanation;Demonstration;Verbal cues;Handout    Comprehension Verbalized understanding;Returned demonstration            PT Short Term Goals - 09/19/19 7672      PT SHORT TERM GOAL #1   Title Patient will demonstrate independent use of home exercise program to maintain progress from in clinic treatments.    Time 2    Period Weeks    Status New    Target Date 10/03/19             PT Long Term Goals - 09/19/19 0925      PT LONG TERM GOAL #1   Title Patient will demonstrate/report pain at worst less than or equal to 2/10 to facilitate minimal limitation in daily activity secondary to pain symptoms.    Time 12    Period Weeks    Status New    Target Date 12/12/19      PT LONG TERM GOAL #2   Title Patient will demonstrate independent use of home exercise program to facilitate ability to maintain/progress functional gains from skilled physical therapy services.    Time 12    Period Weeks    Status New    Target Date 12/12/19      PT LONG TERM GOAL #3   Title Patient will demonstrate return to work/recreational activity at previous level of function without limitations secondary due to condition    Time 12    Period Weeks  Status New    Target Date 12/12/19      PT LONG TERM GOAL #4   Title Patient will  demonstrate Lt [place] joint mobility WFL to facilitate usual self care, dressing, reaching overhead at PLOF s limitation due to symptoms.    Time 12    Period Weeks    Status New    Target Date 12/12/19      PT LONG TERM GOAL #5   Title Patient will demonstrate Lt UE MMT 5/5 throughout to facilitate usual lifting, carrying in functional activity to PLOF s limitation.    Time 12    Period Weeks    Status New    Target Date 12/12/19                  Plan - 09/19/19 0843    Clinical Impression Statement Patient is a 69 y.o. male who comes to clinic with complaints of Lt shoulder pain s/p arthroscopic surgery with mobility, strength and movement coordination deficits that impair their ability to perform usual daily and recreational functional activities without increase difficulty/symptoms at this time.  Patient to benefit from skilled PT services to address impairments and limitations to improve to previous level of function without restriction secondary to condition.    Examination-Activity Limitations Carry;Lift;Reach Overhead    Examination-Participation Restrictions Yard Work;Occupation    Stability/Clinical Decision Making Stable/Uncomplicated    Clinical Decision Making Low    Rehab Potential Good    PT Frequency 2x / week    PT Duration 12 weeks    PT Treatment/Interventions ADLs/Self Care Home Management;Electrical Stimulation;Iontophoresis 4mg /ml Dexamethasone;Moist Heat;Traction;Therapeutic exercise;Therapeutic activities;Functional mobility training;Ultrasound;Neuromuscular re-education;Patient/family education;Manual techniques;Spinal Manipulations;Joint Manipulations;Passive range of motion;Dry needling;Taping;Vasopneumatic Device    PT Next Visit Plan Reassess HEP use, range of motion improvement, strengthening progression from gravity reduced to gravity based on shrug presence    PT Home Exercise Plan [email or web address]    Consulted and Agree with Plan of Care Patient            Patient will benefit from skilled therapeutic intervention in order to improve the following deficits and impairments:  Decreased endurance, Hypomobility, Pain, Impaired UE functional use, Decreased strength, Decreased activity tolerance, Decreased mobility, Decreased range of motion, Impaired perceived functional ability, Impaired flexibility  Visit Diagnosis: Acute pain of left shoulder  Muscle weakness (generalized)  Stiffness of left shoulder, not elsewhere classified     Problem List There are no problems to display for this patient.  Scot Jun, PT, DPT, OCS, ATC 09/19/19  9:28 AM   Updated charges Scot Jun, PT, DPT, OCS, ATC 09/20/19  3:03 PM      [facility] Physical Therapy [address] [place], [place], [address] Phone: [phone]   Fax:  [phone]  Name: Blake Mcclure MRN: [id number] Date of Birth: 02-07-1951

## 2019-09-19 NOTE — Patient Instructions (Signed)
Access Code: [email or web address] URL: [email or web address] Date: 09/19/2019 Prepared by: Scot Jun  Exercises Sleeper Stretch - 2 x daily - 7 x weekly - 1 sets - 5 reps - 30 hold Standing Shoulder Internal Rotation Stretch with Towel - 2 x daily - 7 x weekly - 1 sets - 5 reps - 30 hold

## 2019-09-24 ENCOUNTER — Encounter: Payer: Self-pay | Admitting: Physician Assistant

## 2019-09-24 ENCOUNTER — Ambulatory Visit (INDEPENDENT_AMBULATORY_CARE_PROVIDER_SITE_OTHER): Payer: Medicare Other | Admitting: Physician Assistant

## 2019-09-24 DIAGNOSIS — M7061 Trochanteric bursitis, right hip: Secondary | ICD-10-CM | POA: Diagnosis not present

## 2019-09-24 MED ORDER — LIDOCAINE HCL 1 % IJ SOLN
3.0000 mL | INTRAMUSCULAR | Status: AC | PRN
  Administered 2019-09-24: 3 mL

## 2019-09-24 MED ORDER — METHYLPREDNISOLONE ACETATE 40 MG/ML IJ SUSP
40.0000 mg | INTRAMUSCULAR | Status: AC | PRN
  Administered 2019-09-24: 40 mg via INTRA_ARTICULAR

## 2019-09-24 NOTE — Progress Notes (Signed)
   Procedure Note  Patient: Angie Hogg Epling             Date of Birth: [birth year]           MRN: [id number]             Visit Date: 09/24/2019 HPI: Mr. Tappan returns today requesting cortisone injections right hip trochanteric bursitis.  Had injection 06/18/2019 which helped.  He is having no groin pain.  He has had no new injury.  He denies any numbness tingling down the right leg.  Physical exam: Right hip good range of motion without pain.  Tenderness over the right trochanteric region.  No rashes skin lesions ulcerations abnormal warmth over the lateral right hip.  Procedures: Visit Diagnoses:  1. Trochanteric bursitis, right hip     Large Joint Inj: R greater trochanter on 09/24/2019 4:20 PM Indications: pain Details: 22 G 1.5 in needle, lateral approach  Arthrogram: No  Medications: 3 mL lidocaine 1 %; 40 mg methylPREDNISolone acetate 40 MG/ML Outcome: tolerated well, no immediate complications Procedure, treatment alternatives, risks and benefits explained, specific risks discussed. Consent was given by the patient. Immediately prior to procedure a time out was called to verify the correct patient, procedure, equipment, support staff and site/side marked as required. Patient was prepped and draped in the usual sterile fashion.     Plan: Continue IT band stretching exercises.  Follow-up with [place] as needed for the right hip.  He does have a follow-up appointment with Dr. Ninfa Linden for his left shoulder in the coming weeks we can reassess his hip at that point time if need be.  Questions encouraged and answered.

## 2019-09-25 ENCOUNTER — Other Ambulatory Visit: Payer: Self-pay

## 2019-09-25 ENCOUNTER — Encounter: Payer: Self-pay | Admitting: Rehabilitative and Restorative Service Providers"

## 2019-09-25 ENCOUNTER — Ambulatory Visit (INDEPENDENT_AMBULATORY_CARE_PROVIDER_SITE_OTHER): Payer: Medicare Other | Admitting: Rehabilitative and Restorative Service Providers"

## 2019-09-25 DIAGNOSIS — M25512 Pain in left shoulder: Secondary | ICD-10-CM | POA: Diagnosis not present

## 2019-09-25 DIAGNOSIS — M25612 Stiffness of left shoulder, not elsewhere classified: Secondary | ICD-10-CM | POA: Diagnosis not present

## 2019-09-25 DIAGNOSIS — M6281 Muscle weakness (generalized): Secondary | ICD-10-CM

## 2019-09-25 NOTE — Therapy (Signed)
[facility] Physical Therapy [address] [place], [place], [address] Phone: [phone]   Fax:  [phone]  Physical Therapy Treatment  Patient Details  Name: Blake Mcclure MRN: [id number] Date of Birth: 04/10/1950 Referring Provider (PT): Dr. Ninfa Linden   Encounter Date: 09/25/2019   PT End of Session - 09/25/19 0804    Visit Number 2    Number of Visits 24    Date for PT Re-Evaluation 12/12/19    Authorization Type Medicare    Authorization Time Period 09/19/2019- 12/12/2019    Progress Note Due on Visit 10    PT Start Time 0800    PT Stop Time 0838    PT Time Calculation (min) 38 min    Activity Tolerance Patient tolerated treatment well    Behavior During Therapy [facility] for tasks assessed/performed           History reviewed. No pertinent past medical history.  History reviewed. No pertinent surgical history.  There were no vitals filed for this visit.   Subjective Assessment - 09/25/19 0803    Subjective Pt. indicated feeling no pain at rest in shoulder.  Pt. indicated arm getting a bit better movement.  Hip jection was performed.    Limitations Lifting    Patient Stated Goals Reduce pain    Currently in Pain? No/denies    Pain Score 0-No pain                             [facility] Adult PT Treatment/Exercise - 09/25/19 0001      Exercises   Exercises Shoulder      Shoulder Exercises: Standing   External Rotation Strengthening;Left   3 x 10 c fatigue hold on last rep of each set   Theraband Level (Shoulder External Rotation) Level 4 (Blue)    Internal Rotation Strengthening;Left   3 x 10    Theraband Level (Shoulder Internal Rotation) Level 4 (Blue)    Extension Strengthening;Both;Other (comment)   3 x 10   Theraband Level (Shoulder Extension) Level 4 (Blue)    Corporate treasurer;Both   3 x 10   Theraband Level (Shoulder Row) Level 4 (Blue)    Other Standing Exercises 100 deg flexion circles cw, ccw 45 x 2 each way       Shoulder Exercises: ROM/Strengthening   UBE (Upper Arm Bike) Lvl 3 3 mins fwd/back each way      Shoulder Exercises: Stretch   Other Shoulder Stretches IR rope stretch 30 sec x 5 Lt UE      Manual Therapy   Manual therapy comments contract/relax MET for IR rotation mobility in supie 75 deg abd                    PT Short Term Goals - 09/19/19 5643      PT SHORT TERM GOAL #1   Title Patient will demonstrate independent use of home exercise program to maintain progress from in clinic treatments.    Time 2    Period Weeks    Status New    Target Date 10/03/19             PT Long Term Goals - 09/19/19 0925      PT LONG TERM GOAL #1   Title Patient will demonstrate/report pain at worst less than or equal to 2/10 to facilitate minimal limitation in daily activity secondary to pain symptoms.    Time 12  Period Weeks    Status New    Target Date 12/12/19      PT LONG TERM GOAL #2   Title Patient will demonstrate independent use of home exercise program to facilitate ability to maintain/progress functional gains from skilled physical therapy services.    Time 12    Period Weeks    Status New    Target Date 12/12/19      PT LONG TERM GOAL #3   Title Patient will demonstrate return to work/recreational activity at previous level of function without limitations secondary due to condition    Time 12    Period Weeks    Status New    Target Date 12/12/19      PT LONG TERM GOAL #4   Title Patient will demonstrate Lt [place] joint mobility WFL to facilitate usual self care, dressing, reaching overhead at PLOF s limitation due to symptoms.    Time 12    Period Weeks    Status New    Target Date 12/12/19      PT LONG TERM GOAL #5   Title Patient will demonstrate Lt UE MMT 5/5 throughout to facilitate usual lifting, carrying in functional activity to PLOF s limitation.    Time 12    Period Weeks    Status New    Target Date 12/12/19                 Plan -  09/25/19 0830    Clinical Impression Statement Pt. demonstrated good overall progress from start of treatment cycle c progression in IR mobility behind back.  Continued use of HEP and skilled PT to continue to progress towards long term goals indicated at this time.    Examination-Activity Limitations Carry;Lift;Reach Overhead    Examination-Participation Restrictions Yard Work;Occupation    Stability/Clinical Decision Making Stable/Uncomplicated    Rehab Potential Good    PT Frequency 2x / week    PT Duration 12 weeks    PT Treatment/Interventions ADLs/Self Care Home Management;Electrical Stimulation;Iontophoresis 54m/ml Dexamethasone;Moist Heat;Traction;Therapeutic exercise;Therapeutic activities;Functional mobility training;Ultrasound;Neuromuscular re-education;Patient/family education;Manual techniques;Spinal Manipulations;Joint Manipulations;Passive range of motion;Dry needling;Taping;Vasopneumatic Device    PT Next Visit Plan IR capsular/myofascial mobility improvement, overhead strengthening    PT Home Exercise Plan [email or web address]    Consulted and Agree with Plan of Care Patient           Patient will benefit from skilled therapeutic intervention in order to improve the following deficits and impairments:  Decreased endurance, Hypomobility, Pain, Impaired UE functional use, Decreased strength, Decreased activity tolerance, Decreased mobility, Decreased range of motion, Impaired perceived functional ability, Impaired flexibility  Visit Diagnosis: Acute pain of left shoulder  Muscle weakness (generalized)  Stiffness of left shoulder, not elsewhere classified     Problem List There are no problems to display for this patient.   MScot Jun PT, DPT, OCS, ATC 09/25/19  8:32 AM    [facility] [address] [address]Phone: [phone]  Fax:  [phone] Name: Blake BoneyMRN: [id number] of Birth:  2Mar 01, 1952

## 2019-10-02 ENCOUNTER — Encounter: Payer: Self-pay | Admitting: Rehabilitative and Restorative Service Providers"

## 2019-10-02 ENCOUNTER — Other Ambulatory Visit: Payer: Self-pay

## 2019-10-02 ENCOUNTER — Ambulatory Visit (INDEPENDENT_AMBULATORY_CARE_PROVIDER_SITE_OTHER): Payer: Medicare Other | Admitting: Rehabilitative and Restorative Service Providers"

## 2019-10-02 DIAGNOSIS — M25512 Pain in left shoulder: Secondary | ICD-10-CM | POA: Diagnosis not present

## 2019-10-02 DIAGNOSIS — M25612 Stiffness of left shoulder, not elsewhere classified: Secondary | ICD-10-CM | POA: Diagnosis not present

## 2019-10-02 DIAGNOSIS — M6281 Muscle weakness (generalized): Secondary | ICD-10-CM

## 2019-10-02 NOTE — Therapy (Signed)
[facility] Physical Therapy [address] [place], [place], [address] Phone: [phone]   Fax:  [phone]  Physical Therapy Treatment  Patient Details  Name: Blake Mcclure MRN: [id number] Date of Birth: [birth year] Referring Provider (PT): Dr. Ninfa Linden   Encounter Date: 10/02/2019   PT End of Session - 10/02/19 0807    Visit Number 3    Number of Visits 24    Date for PT Re-Evaluation 12/12/19    Authorization Type Medicare    Authorization Time Period 09/19/2019- 12/12/2019    Progress Note Due on Visit 10    PT Start Time 0758    PT Stop Time 0825    PT Time Calculation (min) 27 min    Activity Tolerance Patient tolerated treatment well    Behavior During Therapy [facility] for tasks assessed/performed           History reviewed. No pertinent past medical history.  History reviewed. No pertinent surgical history.  There were no vitals filed for this visit.   Subjective Assessment - 10/02/19 0759    Subjective Pt. indicated feeling pretty good overall for shoulder.  Had complaint of headache today    Limitations Lifting    Patient Stated Goals Reduce pain    Currently in Pain? No/denies    Pain Score 0-No pain    Pain Location Shoulder    Pain Orientation Left                             [facility] Adult PT Treatment/Exercise - 10/02/19 0001      Exercises   Other Exercises  HEP review c cues      Shoulder Exercises: Sidelying   External Rotation AROM;Other (comment)   3  x10     Shoulder Exercises: Stretch   Other Shoulder Stretches IR rope stretch 30 sec x 5 Lt UE    Other Shoulder Stretches sleeper stretch 30 sec x 5 Lt       Manual Therapy   Manual therapy comments contract/relax MET for IR rotation mobility in supine 75 deg abd, sidelying active compression to Lt infraspinatus                    PT Short Term Goals - 09/19/19 5176      PT SHORT TERM GOAL #1   Title Patient will demonstrate independent  use of home exercise program to maintain progress from in clinic treatments.    Time 2    Period Weeks    Status New    Target Date 10/03/19             PT Long Term Goals - 09/19/19 0925      PT LONG TERM GOAL #1   Title Patient will demonstrate/report pain at worst less than or equal to 2/10 to facilitate minimal limitation in daily activity secondary to pain symptoms.    Time 12    Period Weeks    Status New    Target Date 12/12/19      PT LONG TERM GOAL #2   Title Patient will demonstrate independent use of home exercise program to facilitate ability to maintain/progress functional gains from skilled physical therapy services.    Time 12    Period Weeks    Status New    Target Date 12/12/19      PT LONG TERM GOAL #3   Title Patient will demonstrate return to work/recreational activity at  previous level of function without limitations secondary due to condition    Time 12    Period Weeks    Status New    Target Date 12/12/19      PT LONG TERM GOAL #4   Title Patient will demonstrate Lt [place] joint mobility WFL to facilitate usual self care, dressing, reaching overhead at PLOF s limitation due to symptoms.    Time 12    Period Weeks    Status New    Target Date 12/12/19      PT LONG TERM GOAL #5   Title Patient will demonstrate Lt UE MMT 5/5 throughout to facilitate usual lifting, carrying in functional activity to PLOF s limitation.    Time 12    Period Weeks    Status New    Target Date 12/12/19                 Plan - 10/02/19 0808    Clinical Impression Statement Overall progression well at this time c great improvement in HBB movement noted in Lt compared to evaluation, showing movement to T10 range.  Pt. demonstrated good knowledge of HEP at this time and trial transition to HEP indicated.    Examination-Activity Limitations Carry;Lift;Reach Overhead    Examination-Participation Restrictions Yard Work;Occupation    Stability/Clinical Decision Making  Stable/Uncomplicated    Rehab Potential Good    PT Frequency 2x / week    PT Duration 12 weeks    PT Treatment/Interventions ADLs/Self Care Home Management;Electrical Stimulation;Iontophoresis 55m/ml Dexamethasone;Moist Heat;Traction;Therapeutic exercise;Therapeutic activities;Functional mobility training;Ultrasound;Neuromuscular re-education;Patient/family education;Manual techniques;Spinal Manipulations;Joint Manipulations;Passive range of motion;Dry needling;Taping;Vasopneumatic Device    PT Next Visit Plan HEP transition, return in several weeks if necessary    PT Home Exercise Plan [email or web address]    Consulted and Agree with Plan of Care Patient           Patient will benefit from skilled therapeutic intervention in order to improve the following deficits and impairments:  Decreased endurance, Hypomobility, Pain, Impaired UE functional use, Decreased strength, Decreased activity tolerance, Decreased mobility, Decreased range of motion, Impaired perceived functional ability, Impaired flexibility  Visit Diagnosis: Acute pain of left shoulder  Muscle weakness (generalized)  Stiffness of left shoulder, not elsewhere classified     Problem List There are no problems to display for this patient.  MScot Jun PT, DPT, OCS, ATC 10/02/19  8:31 AM    [facility] [address] [place] [address]Phone: [phone]  Fax:  [phone] Name: SArty LantzyMRN: [id number] of Birth: [birth year]

## 2019-10-09 ENCOUNTER — Encounter: Payer: Medicare Other | Admitting: Rehabilitative and Restorative Service Providers"

## 2019-10-16 ENCOUNTER — Other Ambulatory Visit: Payer: Self-pay

## 2019-10-16 ENCOUNTER — Encounter: Payer: Self-pay | Admitting: Rehabilitative and Restorative Service Providers"

## 2019-10-16 ENCOUNTER — Ambulatory Visit (INDEPENDENT_AMBULATORY_CARE_PROVIDER_SITE_OTHER): Payer: Medicare Other | Admitting: Rehabilitative and Restorative Service Providers"

## 2019-10-16 DIAGNOSIS — M25512 Pain in left shoulder: Secondary | ICD-10-CM

## 2019-10-16 DIAGNOSIS — M25612 Stiffness of left shoulder, not elsewhere classified: Secondary | ICD-10-CM

## 2019-10-16 DIAGNOSIS — M6281 Muscle weakness (generalized): Secondary | ICD-10-CM | POA: Diagnosis not present

## 2019-10-16 NOTE — Therapy (Signed)
[facility] [place], [place], [address] Phone: [phone]   Fax:  [phone]  Physical Therapy Treatment/Discharge  Patient Details  Name: Blake Mcclure MRN: [id number] Date of Birth: [birth year] Referring Provider (PT): Dr. Ninfa Linden   Encounter Date: 10/16/2019   PT End of Session - 10/16/19 1521    Visit Number 4    Number of Visits 24    Date for PT Re-Evaluation 12/12/19    Authorization Type Medicare    Authorization Time Period 09/19/2019- 12/12/2019    Progress Note Due on Visit 10    PT Start Time 8032    PT Stop Time 1539    PT Time Calculation (min) 25 min    Activity Tolerance Patient tolerated treatment well    Behavior During Therapy [facility] for tasks assessed/performed           History reviewed. No pertinent past medical history.  History reviewed. No pertinent surgical history.  There were no vitals filed for this visit.   Subjective Assessment - 10/16/19 1520    Subjective Pt. indicated no complaints from shoulder upon inquiry today.  Pt. indicated good with plan to D/C.    Limitations Lifting    Patient Stated Goals Reduce pain    Currently in Pain? No/denies    Pain Score 0-No pain              [facility] PT Assessment - 10/16/19 0001      Assessment   Medical Diagnosis S/P Lt shoulder subacromial decompression    Referring Provider (PT) Dr. Ninfa Linden    Onset Date/Surgical Date 05/31/19    Hand Dominance Left      AROM   Overall AROM Comments Lt HBB  T8      Strength   Left Shoulder Flexion 5/5    Left Shoulder ABduction 5/5                         [facility] Adult PT Treatment/Exercise - 10/16/19 0001      Exercises   Other Exercises  HEP review c cues      Shoulder Exercises: ROM/Strengthening   UBE (Upper Arm Bike) Lvl 3.5 5 mins fwd/back each way      Shoulder Exercises: Stretch   Other Shoulder Stretches IR rope stretch 30 sec x 3 Lt UE    Other Shoulder Stretches  sleeper stretch 30 sec x 3                  PT Education - 10/16/19 1520    Education Details Review of HEP for D/C.    Person(s) Educated Patient    Methods Explanation;Demonstration    Comprehension Verbalized understanding;Returned demonstration            PT Short Term Goals - 10/16/19 1521      PT SHORT TERM GOAL #1   Title Patient will demonstrate independent use of home exercise program to maintain progress from in clinic treatments.    Time 2    Period Weeks    Status Achieved    Target Date 10/03/19             PT Long Term Goals - 10/16/19 1521      PT LONG TERM GOAL #1   Title Patient will demonstrate/report pain at worst less than or equal to 2/10 to facilitate minimal limitation in daily activity secondary to pain symptoms.    Time 12  Period Weeks    Status Achieved      PT LONG TERM GOAL #2   Title Patient will demonstrate independent use of home exercise program to facilitate ability to maintain/progress functional gains from skilled physical therapy services.    Time 12    Period Weeks    Status Achieved      PT LONG TERM GOAL #3   Title Patient will demonstrate return to work/recreational activity at previous level of function without limitations secondary due to condition    Time 12    Period Weeks    Status Achieved      PT LONG TERM GOAL #4   Title Patient will demonstrate Lt [place] joint mobility WFL to facilitate usual self care, dressing, reaching overhead at PLOF s limitation due to symptoms.    Time 12    Period Weeks    Status Achieved      PT LONG TERM GOAL #5   Title Patient will demonstrate Lt UE MMT 5/5 throughout to facilitate usual lifting, carrying in functional activity to PLOF s limitation.    Time 12    Period Weeks    Status Achieved                 Plan - 10/16/19 1522    Clinical Impression Statement Pt. has attended 4 visits overall during course of treatment.  GROC +7. Overall presentation showed  good progress c reduced symptoms, improved mobility/strength as documented.  Pt. appropriate for d/c to HEP at this time.    Examination-Activity Limitations Carry;Lift;Reach Overhead    Examination-Participation Restrictions Yard Work;Occupation    Stability/Clinical Decision Making Stable/Uncomplicated    Rehab Potential Good    PT Frequency 2x / week    PT Duration 12 weeks    PT Treatment/Interventions ADLs/Self Care Home Management;Electrical Stimulation;Iontophoresis 59m/ml Dexamethasone;Moist Heat;Traction;Therapeutic exercise;Therapeutic activities;Functional mobility training;Ultrasound;Neuromuscular re-education;Patient/family education;Manual techniques;Spinal Manipulations;Joint Manipulations;Passive range of motion;Dry needling;Taping;Vasopneumatic Device    PT Next Visit Plan HEP transition    PT Home Exercise Plan [email or web address]    Consulted and Agree with Plan of Care Patient           Patient will benefit from skilled therapeutic intervention in order to improve the following deficits and impairments:  Decreased endurance, Hypomobility, Pain, Impaired UE functional use, Decreased strength, Decreased activity tolerance, Decreased mobility, Decreased range of motion, Impaired perceived functional ability, Impaired flexibility  Visit Diagnosis: Acute pain of left shoulder  Muscle weakness (generalized)  Stiffness of left shoulder, not elsewhere classified     Problem List There are no problems to display for this patient.  PHYSICAL THERAPY DISCHARGE SUMMARY  Visits from Start of Care: 4  Current functional level related to goals / functional outcomes: See note   Remaining deficits: See note   Education / Equipment: See note Plan: Patient agrees to discharge.  Patient goals were met. Patient is being discharged due to meeting the stated rehab goals.  ?????    MScot Jun PT, DPT, OCS, ATC 10/16/19  3:43 PM    [facility] [address] [place] [address]Phone: [phone]  Fax:  [phone] Name: SBenney SommervilleMRN: [id number] of Birth: [birth year]

## 2019-10-30 ENCOUNTER — Encounter: Payer: Self-pay | Admitting: Rehabilitative and Restorative Service Providers"

## 2019-12-19 ENCOUNTER — Encounter: Payer: Self-pay | Admitting: Physician Assistant

## 2019-12-19 ENCOUNTER — Ambulatory Visit (INDEPENDENT_AMBULATORY_CARE_PROVIDER_SITE_OTHER): Payer: Medicare Other | Admitting: Physician Assistant

## 2019-12-19 DIAGNOSIS — M25512 Pain in left shoulder: Secondary | ICD-10-CM | POA: Diagnosis not present

## 2019-12-19 DIAGNOSIS — M7061 Trochanteric bursitis, right hip: Secondary | ICD-10-CM

## 2019-12-19 DIAGNOSIS — E119 Type 2 diabetes mellitus without complications: Secondary | ICD-10-CM | POA: Insufficient documentation

## 2019-12-19 DIAGNOSIS — I10 Essential (primary) hypertension: Secondary | ICD-10-CM | POA: Insufficient documentation

## 2019-12-19 DIAGNOSIS — R7989 Other specified abnormal findings of blood chemistry: Secondary | ICD-10-CM | POA: Insufficient documentation

## 2019-12-19 DIAGNOSIS — E663 Overweight: Secondary | ICD-10-CM | POA: Insufficient documentation

## 2019-12-19 DIAGNOSIS — E559 Vitamin D deficiency, unspecified: Secondary | ICD-10-CM | POA: Insufficient documentation

## 2019-12-19 DIAGNOSIS — G4700 Insomnia, unspecified: Secondary | ICD-10-CM | POA: Insufficient documentation

## 2019-12-19 DIAGNOSIS — N400 Enlarged prostate without lower urinary tract symptoms: Secondary | ICD-10-CM | POA: Insufficient documentation

## 2019-12-19 DIAGNOSIS — E785 Hyperlipidemia, unspecified: Secondary | ICD-10-CM | POA: Insufficient documentation

## 2019-12-19 MED ORDER — MELOXICAM 7.5 MG PO TABS
7.5000 mg | ORAL_TABLET | Freq: Two times a day (BID) | ORAL | 3 refills | Status: DC
Start: 2019-12-19 — End: 2020-01-16

## 2019-12-19 NOTE — Progress Notes (Signed)
Office Visit Note   Patient: Blake Mcclure           Date of Birth: 1950-04-29           MRN: [id number] Visit Date: 12/19/2019              Requested by: Blake Mcclure, Blake Mcclure,  Blake Mcclure PCP: Blake Blade, MD   Assessment & Plan: Visit Diagnoses:  1. Trochanteric bursitis, right hip   2. Left shoulder pain, unspecified chronicity     Plan: We will place him back on his meloxicam which she was taking in the past.  I like for him to take it twice a day for the next 2 weeks and then as needed thereafter.  We will send in formal physical therapy for IT band stretching home exercise program and modalities to right hip.  In regards to his left shoulder I did go over the operative report with him again in the arthroscopy pictures we did have moderate arthritis of the glenohumeral joint but no exposed bone.  We will see how resuming his NSAID helps with his shoulder pain.  Have him follow-up with Blake Mcclure in 1 month.  Follow-Up Instructions: Return in about 4 weeks (around 01/16/2020), or Blake Mcclure.   Orders:  No orders of the defined types were placed in this encounter.  Meds ordered this encounter  Medications  . meloxicam (MOBIC) 7.5 MG tablet    Sig: Take 1 tablet (7.5 mg total) by mouth in the morning and at bedtime.    Dispense:  60 tablet    Refill:  3      Procedures: No procedures performed   Clinical Data: No additional findings.   Subjective: Chief Complaint  Patient presents with  . Left Shoulder - Pain    HPI Blake Mcclure returns today stating that the left shoulder pain continues particularly at night.  Does state that the intra-articular injection done by Blake Mcclure on 09/12/2019 did give him a few days of relief but his pain is returned.  He is having no numbness tingling down the left arm.  Pain is deep in the shoulder joint.  He has no significant pain during the day.  He continues to do the home exercise  program as taught by therapy. In regards to his right hip pain he states that last trochanteric injection back in August helped for about a week.  He has had no new injury to the hip.  Denies any numbness tingling down the leg.  Review of Systems  Constitutional: Negative for chills and fever.     Objective: Vital Signs: There were no vitals taken for this visit.  Physical Exam Constitutional:      Appearance: He is not ill-appearing.  Pulmonary:     Effort: Pulmonary effort is normal.  Neurological:     Mental Status: He is alert and oriented to person, place, and time.  Psychiatric:        Mood and Affect: Mood normal.     Ortho Exam Right hip good range of motion without pain.  Tenderness over the right trochanteric region. Specialty Comments:  No specialty comments available.  Imaging: No results found.   PMFS History: Patient Active Problem List   Diagnosis Date Noted  . Benign prostatic hyperplasia without urinary obstruction 12/19/2019  . Benign essential hypertension 12/19/2019  . Decreased testosterone level 12/19/2019  . Hyperlipidemia 12/19/2019  . Insomnia 12/19/2019  . Overweight 12/19/2019  .  Type 2 diabetes mellitus without complication ([place]) 40/98/2867  . Vitamin D deficiency 12/19/2019  . Impotence 11/08/2018  . Gastroesophageal reflux disease 12/23/2014   History reviewed. No pertinent past medical history.  Family History  Problem Relation Age of Onset  . Healthy Mother   . Healthy Father     History reviewed. No pertinent surgical history. Social History   Occupational History  . Not on file  Tobacco Use  . Smoking status: Never Smoker  . Smokeless tobacco: Never Used  Vaping Use  . Vaping Use: Never used  Substance and Sexual Activity  . Alcohol use: Not Currently  . Drug use: Not on file  . Sexual activity: Not on file

## 2019-12-25 ENCOUNTER — Telehealth: Payer: Self-pay | Admitting: Physician Assistant

## 2019-12-25 NOTE — Telephone Encounter (Signed)
Called patient he just wanted to know what he could take for a HA , told to take Tylenol .

## 2019-12-25 NOTE — Telephone Encounter (Signed)
Please advise 

## 2019-12-25 NOTE — Telephone Encounter (Signed)
Patient called requesting a call back from [facility] or nurse. Patient states pain medication has been giving him a head ache and Asprin gives him a head ache also and need medical advise on what he can take. Please call patient at [phone].

## 2020-01-07 ENCOUNTER — Other Ambulatory Visit: Payer: Self-pay

## 2020-01-07 ENCOUNTER — Ambulatory Visit (INDEPENDENT_AMBULATORY_CARE_PROVIDER_SITE_OTHER): Payer: Medicare Other | Admitting: Physical Therapy

## 2020-01-07 ENCOUNTER — Encounter: Payer: Self-pay | Admitting: Physical Therapy

## 2020-01-07 DIAGNOSIS — M6281 Muscle weakness (generalized): Secondary | ICD-10-CM

## 2020-01-07 DIAGNOSIS — M25551 Pain in right hip: Secondary | ICD-10-CM | POA: Diagnosis not present

## 2020-01-07 DIAGNOSIS — R29898 Other symptoms and signs involving the musculoskeletal system: Secondary | ICD-10-CM | POA: Diagnosis not present

## 2020-01-07 NOTE — Therapy (Signed)
[facility] [address] [place], [place], [address] Phone: [phone]   Fax:  [phone]  Physical Therapy Evaluation  Patient Details  Name: Blake Mcclure MRN: [id number] Date of Birth: 1950-03-05 Referring Provider (PT): Pete Pelt, [place]   Encounter Date: 01/07/2020   PT End of Session - 01/07/20 1552    Visit Number 1    Number of Visits 12    Date for PT Re-Evaluation 02/18/20    Authorization Type Medicare/Tricare    Progress Note Due on Visit 10    PT Start Time 9326    PT Stop Time 1547    PT Time Calculation (min) 32 min    Activity Tolerance Patient tolerated treatment well    Behavior During Therapy [facility] for tasks assessed/performed           History reviewed. No pertinent past medical history.  History reviewed. No pertinent surgical history.  There were no vitals filed for this visit.    Subjective Assessment - 01/07/20 1520    Subjective Pt is a 69 y/o male who presents to OPPT for Rt hip pain x almost 1 year without known injury.  He has had 2 steroid injections without benefit.  He reports difficulty in the morning and standing.    Limitations Sitting    How long can you sit comfortably? getting up after sitting > 15-20 min    Diagnostic tests xrays Feb 2021: no arthritis    Patient Stated Goals improve pain    Currently in Pain? Yes    Pain Score 0-No pain   up to 4/10   Pain Location Hip    Pain Orientation Right;Posterior;Lateral    Pain Descriptors / Indicators Tiring;Sore    Pain Type Chronic pain    Pain Onset More than a month ago    Pain Frequency Intermittent    Aggravating Factors  getting up in AM, after prolonged sitting    Pain Relieving Factors walking              [facility] PT Assessment - 01/07/20 1524      Assessment   Medical Diagnosis M70.61 (ICD-10-CM) - Trochanteric bursitis, right hip    Referring Provider (PT) Pete Pelt, PA-C    Onset Date/Surgical Date --   Feb  2021   Hand Dominance Left    Next MD Visit 01/16/20    Prior Therapy at this clinic (8 total visits) for shoulder      Precautions   Precautions None      Restrictions   Weight Bearing Restrictions No      Balance Screen   Has the patient fallen in the past 6 months No    Has the patient had a decrease in activity level because of a fear of falling?  No    Is the patient reluctant to leave their home because of a fear of falling?  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, lifting up to 75#, some seated computer work    Leisure mall walking, gardening; no regular exercise - was running 2-3 miles prior to  hip hurting      Cognition   Overall Cognitive Status Within Functional Limits for tasks assessed      Observation/Other Assessments   Focus on Therapeutic Outcomes (FOTO)  82 (predicted 80)      ROM / Strength   AROM / PROM / Strength AROM;Strength      AROM   Overall AROM Comments Rt hip WNL with end range tightness noted in flexion      Strength   Strength Assessment Site Hip    Right/Left Hip Right;Left    Right Hip Flexion 4/5    Right Hip Extension 4/5    Right Hip External Rotation  3+/5    Right Hip Internal Rotation 5/5    Right Hip ABduction 4/5    Left Hip Flexion 5/5    Left Hip Extension 5/5    Left Hip External Rotation 4/5    Left Hip Internal Rotation 5/5    Left Hip ABduction 5/5      Flexibility   Soft Tissue Assessment /Muscle Length yes    Hamstrings tightness bil    Piriformis tightness Rt>Lt      Palpation   Palpation comment active trigger point with reproduction of pain Rt pirifomis      Special Tests    Special Tests Hip Special Tests     Hip Special Tests  Saralyn Pilar (FABER) Test      Saralyn Pilar [facility]) Test   Findings Negative    Side Right                      Objective measurements completed on examination: See above findings.       [facility] Adult PT Treatment/Exercise - 01/07/20 1551      Exercises   Exercises Other Exercises    Other Exercises  see pt instructions - performed 1 rep of each exercise with cues for technique      Manual Therapy   Manual Therapy Soft tissue mobilization    Soft tissue mobilization Rt piriformis with compression            Trigger Point Dry Needling - 01/07/20 1551    Consent Given? Yes    Education Handout Provided Yes    Muscles Treated Back/Hip Piriformis    Piriformis Response Twitch response elicited                PT Education - 01/07/20 1552    Education Details HEP, DN    Person(s) Educated Patient    Methods Explanation;Demonstration;Handout    Comprehension Returned demonstration;Verbalized understanding;Need further instruction            PT Short Term Goals - 01/07/20 1556      PT SHORT TERM GOAL #1   Title Patient will demonstrate independent use of home exercise program to maintain progress from in clinic treatments.    Status New    Target Date 01/21/20             PT Long Term Goals - 01/07/20 1557      PT LONG TERM GOAL #1   Title Patient will demonstrate/report pain at worst less than or equal to 2/10 to facilitate minimal limitation in daily activity secondary to pain symptoms.    Status New    Target Date 02/18/20      PT LONG TERM GOAL #2   Title Patient will demonstrate independent use of home exercise program to facilitate ability to maintain/progress functional gains from skilled  physical therapy services.    Status New    Target Date 02/18/20      PT LONG TERM GOAL #3   Title Demonstrate 5/5 Rt hip strength for improved function    Status New    Target Date 02/18/20      PT LONG TERM GOAL #4   Title report  50% decrease in stiffness upon waking in AM for improved function    Status New    Target Date 02/18/20      PT LONG TERM GOAL #5   Title FOTO score maintained for him to continue with regular and usual activity    Status New    Target Date 02/18/20                  Plan - 01/07/20 1553    Clinical Impression Statement Pt is a 69 y/o male who presents to OPPT for Rt hip pain.  He demonstrates decreased strength and flexibility, as well as active trigger points affecting functional mobility.  Pt will benefit from PT to address deficits listed.    Examination-Activity Limitations Sit;Bend;Squat;Locomotion Level    Examination-Participation Restrictions Occupation    Stability/Clinical Decision Making Stable/Uncomplicated    Clinical Decision Making Low    Rehab Potential Good    PT Frequency 2x / week   1-2x/wk   PT Duration 6 weeks    PT Treatment/Interventions ADLs/Self Care Home Management;Electrical Stimulation;Cryotherapy;Iontophoresis 4mg /ml Dexamethasone;Moist Heat;Therapeutic exercise;Therapeutic activities;Functional mobility training;Stair training;Gait training;Ultrasound;Neuromuscular re-education;Patient/family education;Manual techniques;Taping;Dry needling;Passive range of motion    PT Next Visit Plan review HEP, assess response to DN, continue with hip strengthening/flexibility    PT Home Exercise Plan Access Code: [email or web address]           Patient will benefit from skilled therapeutic intervention in order to improve the following deficits and impairments:  Pain, Increased fascial restricitons, Increased muscle spasms, Impaired flexibility, Decreased strength  Visit Diagnosis: Pain in right hip - Plan: PT plan of care cert/re-cert  Other symptoms and signs involving the musculoskeletal system - Plan: PT plan of care cert/re-cert  Muscle weakness (generalized) - Plan: PT plan of care cert/re-cert     Problem List Patient Active Problem List   Diagnosis Date  Noted  . Benign prostatic hyperplasia without urinary obstruction 12/19/2019  . Benign essential hypertension 12/19/2019  . Decreased testosterone level 12/19/2019  . Hyperlipidemia 12/19/2019  . Insomnia 12/19/2019  . Overweight 12/19/2019  . Type 2 diabetes mellitus without complication ([place]) 26/71/2458  . Vitamin D deficiency 12/19/2019  . Impotence 11/08/2018  . Gastroesophageal reflux disease 12/23/2014      Laureen Abrahams, PT, DPT 01/07/20 4:01 PM     [facility] [address] [place], [place], [address] Phone: [phone]   Fax:  [phone]  Name: Blake Mcclure MRN: [id number] Date of Birth: 11-11-1950

## 2020-01-07 NOTE — Patient Instructions (Signed)
Access Code: [email or web address] URL: [email or web address] Date: 01/07/2020 Prepared by: Faustino Congress  Exercises Supine Piriformis Stretch with Foot on Ground - 2 x daily - 7 x weekly - 3 reps - 1 sets - 30 sec hold Supine Hamstring Stretch with Strap - 2 x daily - 7 x weekly - 3 reps - 1 sets - 30 sec hold Clamshell - 2 x daily - 7 x weekly - 2 sets - 10 reps - 2-3 sec hold Supine Bridge - 2 x daily - 7 x weekly - 10 reps - 1 sets - 5 sec hold Self Release - 2-3 x daily - 7 x weekly - 1 sets - 1 reps - 2-3 min hold  Patient Education Trigger Point Dry Needling

## 2020-01-16 ENCOUNTER — Ambulatory Visit (INDEPENDENT_AMBULATORY_CARE_PROVIDER_SITE_OTHER): Payer: Medicare Other | Admitting: Physician Assistant

## 2020-01-16 ENCOUNTER — Encounter: Payer: Self-pay | Admitting: Physician Assistant

## 2020-01-16 VITALS — BP 158/88 | HR 77

## 2020-01-16 DIAGNOSIS — M7061 Trochanteric bursitis, right hip: Secondary | ICD-10-CM

## 2020-01-16 DIAGNOSIS — I10 Essential (primary) hypertension: Secondary | ICD-10-CM

## 2020-01-16 DIAGNOSIS — M25512 Pain in left shoulder: Secondary | ICD-10-CM | POA: Diagnosis not present

## 2020-01-16 NOTE — Progress Notes (Signed)
HPI: Mr. Winkels returns today for follow-up of his right hip trochanteric bursitis in his left shoulder pain.  He states neither of these were gotten better.  He did go back on the Mobic was on 50 mg in the past without any side effects however this time whenever he went on 7.5 mg once daily he started developing a headache.  He did get a physical therapy for 1 visit on 01/07/2020 states that since undergoing dry needling in his low back area and piriformis region he has had low back pain.  He only has right hip pain whenever he wakes up in the morning and is on the lateral side he denies any radicular symptoms down the leg.  Regards to his shoulder he states that with home exercises that the shoulder feels better but he has difficulty going to sleep due to shoulder pain at night.  Again he did have some glenohumeral joint arthritic changes but no bone-on-bone changes were seen when he underwent the shoulder arthroscopy.  Physical exam: Blood pressure 155/88   Right hip excellent range of motion without pain.  He is nontender over the trochanteric region.  There is no rashes skin lesions ulcerations ecchymosis in the right lower back region.  He ambulates without an assistive device.  Impression: Left shoulder pain with glenohumeral arthritic changes Right hip trochanteric bursitis Hypertension  Plan: We will have him stop his Mobic at this point time no other NSAIDs.  He will follow-up with his primary care physician in the near future refer evaluation of his blood pressure.  In regards to the shoulder we will add on left shoulder range of motion strengthening modalities and home exercise program.  He will continue therapy for his right hip.  Follow-up with [place] in 6 weeks.

## 2020-01-17 ENCOUNTER — Telehealth: Payer: Self-pay | Admitting: Physician Assistant

## 2020-01-17 NOTE — Telephone Encounter (Signed)
No new RX he is to stop his MOBIC take no other NSAIDS and see his PCP for his blood pressure.

## 2020-01-17 NOTE — Telephone Encounter (Signed)
Pt's called and advised and stated understanding

## 2020-01-17 NOTE — Telephone Encounter (Signed)
Pt called stating he had an appt on 01/16/20 and Artis Delay told him he would be changing his rx but nothing was ever sent to the pharmacy and the pt isn't sure the name of the new rx. He would like to have it called in though and he would like to be notified when this is done.   [phone]

## 2020-01-17 NOTE — Addendum Note (Signed)
Addended by: Robyne Peers on: 01/17/2020 08:40 AM   Modules accepted: Orders

## 2020-01-22 ENCOUNTER — Ambulatory Visit (INDEPENDENT_AMBULATORY_CARE_PROVIDER_SITE_OTHER): Payer: Medicare Other | Admitting: Rehabilitative and Restorative Service Providers"

## 2020-01-22 ENCOUNTER — Encounter: Payer: Self-pay | Admitting: Rehabilitative and Restorative Service Providers"

## 2020-01-22 ENCOUNTER — Other Ambulatory Visit: Payer: Self-pay

## 2020-01-22 DIAGNOSIS — M6281 Muscle weakness (generalized): Secondary | ICD-10-CM

## 2020-01-22 DIAGNOSIS — M25551 Pain in right hip: Secondary | ICD-10-CM | POA: Diagnosis not present

## 2020-01-22 DIAGNOSIS — R29898 Other symptoms and signs involving the musculoskeletal system: Secondary | ICD-10-CM

## 2020-01-22 NOTE — Therapy (Addendum)
[facility] [address] [place], [place], [address] Phone: [phone]   Fax:  [phone]  Physical Therapy Treatment  Patient Details  Name: Blake Mcclure MRN: [id number] Date of Birth: Nov 29, 1950 Referring Provider (PT): Pete Pelt, [place]   Encounter Date: 01/22/2020   PT End of Session - 01/22/20 1527    Visit Number 2    Number of Visits 12    Date for PT Re-Evaluation 02/18/20    Authorization Type Medicare/Tricare    Progress Note Due on Visit 10    PT Start Time 1519    PT Stop Time 1545    PT Time Calculation (min) 26 min    Activity Tolerance Patient tolerated treatment well    Behavior During Therapy [facility] for tasks assessed/performed           History reviewed. No pertinent past medical history.  History reviewed. No pertinent surgical history.  There were no vitals filed for this visit.   Subjective Assessment - 01/22/20 1520    Subjective Pt. indicated 1/10 or so today.  Felt sore after last visit but seemed to help.    Limitations Sitting    How long can you sit comfortably? getting up after sitting > 15-20 min    Diagnostic tests xrays Feb 2021: no arthritis    Patient Stated Goals improve pain    Currently in Pain? Yes    Pain Score 1     Pain Location Hip    Pain Orientation Right;Posterior    Pain Onset More than a month ago                             [facility] Adult PT Treatment/Exercise - 01/22/20 0001      Exercises   Exercises Knee/Hip      Knee/Hip Exercises: Stretches   Other Knee/Hip Stretches SKC 15 sec x 3 Rt, figure 4 piriformis stretch 15 sec x 3 Rt         Manual compression to Rt glute max    Trigger Point Dry Needling - 01/22/20 0001    Consent Given? Yes    Education Handout Provided No    Muscles Treated Back/Hip Gluteus maximus    Gluteus Maximus Response Twitch response elicited                  PT Short Term Goals - 01/22/20 1529      PT  SHORT TERM GOAL #1   Title Patient will demonstrate independent use of home exercise program to maintain progress from in clinic treatments.    Status Achieved    Target Date 01/21/20             PT Long Term Goals - 01/07/20 1557      PT LONG TERM GOAL #1   Title Patient will demonstrate/report pain at worst less than or equal to 2/10 to facilitate minimal limitation in daily activity secondary to pain symptoms.    Status New    Target Date 02/18/20      PT LONG TERM GOAL #2   Title Patient will demonstrate independent use of home exercise program to facilitate ability to maintain/progress functional gains from skilled physical therapy services.    Status New    Target Date 02/18/20      PT LONG TERM GOAL #3   Title Demonstrate 5/5 Rt hip strength for improved function    Status New  Target Date 02/18/20      PT LONG TERM GOAL #4   Title report 50% decrease in stiffness upon waking in AM for improved function    Status New    Target Date 02/18/20      PT LONG TERM GOAL #5   Title FOTO score maintained for him to continue with regular and usual activity    Status New    Target Date 02/18/20                 Plan - 01/22/20 1528    Clinical Impression Statement Superior aspect of glute max TrP c concordant symptoms identified today and treated to release for pain relief.  Reviewed HEP c good techniques overall observed.    Examination-Activity Limitations Sit;Bend;Squat;Locomotion Level    Examination-Participation Restrictions Occupation    Stability/Clinical Decision Making Stable/Uncomplicated    Rehab Potential Good    PT Frequency 2x / week   1-2x/wk   PT Duration 6 weeks    PT Treatment/Interventions ADLs/Self Care Home Management;Electrical Stimulation;Cryotherapy;Iontophoresis 4mg /ml Dexamethasone;Moist Heat;Therapeutic exercise;Therapeutic activities;Functional mobility training;Stair training;Gait training;Ultrasound;Neuromuscular  re-education;Patient/family education;Manual techniques;Taping;Dry needling;Passive range of motion    PT Next Visit Plan DN, continue with hip strengthening/flexibility    PT Home Exercise Plan Access Code: [email or web address]           Patient will benefit from skilled therapeutic intervention in order to improve the following deficits and impairments:  Pain,Increased fascial restricitons,Increased muscle spasms,Impaired flexibility,Decreased strength  Visit Diagnosis: Pain in right hip  Other symptoms and signs involving the musculoskeletal system  Muscle weakness (generalized)     Problem List Patient Active Problem List   Diagnosis Date Noted  . Benign prostatic hyperplasia without urinary obstruction 12/19/2019  . Benign essential hypertension 12/19/2019  . Decreased testosterone level 12/19/2019  . Hyperlipidemia 12/19/2019  . Insomnia 12/19/2019  . Overweight 12/19/2019  . Type 2 diabetes mellitus without complication ([place]) 42/35/3614  . Vitamin D deficiency 12/19/2019  . Impotence 11/08/2018  . Gastroesophageal reflux disease 12/23/2014    Scot Jun, PT, DPT, OCS, ATC 01/22/20  3:35 PM   Scot Jun, PT, DPT, OCS, ATC 01/24/20  4:15 PM     [facility] [address] [place], [place], [address] Phone: [phone]   Fax:  [phone]  Name: Blake Mcclure MRN: [id number] Date of Birth: 01-25-1951

## 2020-01-24 ENCOUNTER — Other Ambulatory Visit: Payer: Self-pay

## 2020-01-24 ENCOUNTER — Ambulatory Visit (INDEPENDENT_AMBULATORY_CARE_PROVIDER_SITE_OTHER): Payer: Medicare Other | Admitting: Rehabilitative and Restorative Service Providers"

## 2020-01-24 DIAGNOSIS — M25512 Pain in left shoulder: Secondary | ICD-10-CM | POA: Diagnosis not present

## 2020-01-24 DIAGNOSIS — M6281 Muscle weakness (generalized): Secondary | ICD-10-CM

## 2020-01-24 DIAGNOSIS — M25551 Pain in right hip: Secondary | ICD-10-CM

## 2020-01-24 NOTE — Therapy (Signed)
[place], [place], [address] Phone: [phone]   Fax:  [phone]  Physical Therapy Treatment/Recertification  Patient Details  Name: Blake Mcclure MRN: [id number] Date of Birth: 08-Feb-1951 Referring Provider (PT): Dr. Erlinda Hong   Encounter Date: 01/24/2020      PT End of Session - 01/24/20 1620    Visit Number 3    Number of Visits 12    Date for PT Re-Evaluation 02/18/20    Authorization Type Medicare/Tricare    Progress Note Due on Visit 12    PT Start Time 1600    PT Stop Time 1635    PT Time Calculation (min) 35 min    Activity Tolerance Patient tolerated treatment well    Behavior During Therapy [facility] for tasks assessed/performed           No past medical history on file.  No past surgical history on file.  There were no vitals filed for this visit.   Subjective Assessment - 01/24/20 1602    Subjective Pt. indicated Lt shoulder feels some trouble at night and in moring and then gets better.    Limitations Sitting    How long can you sit comfortably? getting up after sitting > 15-20 min    Diagnostic tests xrays Feb 2021: no arthritis    Patient Stated Goals improve pain    Currently in Pain? Yes    Pain Score 7    at worst   Pain Orientation Right    Pain Descriptors / Indicators Other (Comment)   pinching   Pain Type Chronic pain    Pain Onset More than a month ago    Aggravating Factors  morning, nighttime lying on it    Pain Relieving Factors getting up and movement.              [facility] PT Assessment - 01/24/20 0001      Assessment   Medical Diagnosis M70.61 (ICD-10-CM) - Trochanteric bursitis, right hip, Lt shoulder    Referring Provider (PT) Dr. 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  Mild discomfort c Lt cervical rotation, Rt side bendin      Strength   Overall Strength Comments Lt shoulder MMT 5/5 throughout c no complaints      Flexibility   Hamstrings tightness bil    Piriformis tightness Rt>Lt      Palpation   Palpation comment TrP noted in Lt upper trap c concordant symptoms  [facility] Adult PT Treatment/Exercise - 01/24/20 0001      Exercises   Exercises Shoulder      Shoulder Exercises: ROM/Strengthening   UBE (Upper Arm Bike) Lvl 3.0 2.5 mins alt fwd/back for 10 mins total      Shoulder Exercises: Stretch   Other Shoulder Stretches Lt upper trap stretch 15 sec x 5      Modalities   Modalities Electrical Stimulation;Moist Heat      Moist Heat Therapy   Number Minutes Moist Heat 10 Minutes    Moist Heat Location Other (comment)   Lt upper trap     Electrical Stimulation   Electrical Stimulation Location Lt upper trap    Electrical Stimulation Action Pre mod    Electrical Stimulation Parameters 10 mins to tolerance    Electrical Stimulation Goals Pain      Manual Therapy   Soft tissue mobilization compresion to Lt upper trap                  PT Education - 01/24/20 1617    Education Details HEP for upper trap    Person(s) Educated Patient    Methods Explanation;Demonstration;Verbal cues    Comprehension  Verbalized understanding;Returned demonstration            PT Short Term Goals - 01/22/20 1529      PT SHORT TERM GOAL #1   Title Patient will demonstrate independent use of home exercise program to maintain progress from in clinic treatments.    Status Achieved    Target Date 01/21/20             PT Long Term Goals - 01/24/20 1617      PT LONG TERM GOAL #1   Title Patient will demonstrate/report pain at worst less than or equal to 2/10 to facilitate minimal limitation in daily activity secondary to pain symptoms.    Status On-going    Target Date 02/18/20      PT LONG TERM GOAL #2   Title Patient will demonstrate independent use of home exercise program to facilitate ability to maintain/progress functional gains from skilled physical therapy services.    Time 0    Status On-going    Target Date 02/18/20      PT LONG TERM GOAL #3   Title Demonstrate 5/5 Rt hip strength for improved function    Status On-going    Target Date 02/18/20      PT LONG TERM GOAL #4   Title report 50% decrease in stiffness upon waking in AM for improved function    Status On-going    Target Date 02/18/20      PT LONG TERM GOAL #5   Title FOTO score maintained for him to continue with regular and usual activity    Status On-going    Target Date 02/18/20                 Plan - 01/24/20 1617    Clinical Impression Statement Today's visit included assessment for Lt superior shoulder complaints.  Presentation of Lt upper trap/superior shoulder pain c difficulty noted in mobility and nighttime/morning movement/resting symptoms.  See objective data for updated information.  Pt. to benefit from skilled PT services to facilitate ability to perform daily activity and reach established goals s limitation.    Examination-Activity Limitations Sit;Bend;Squat;Locomotion Level;Sleep    Examination-Participation Restrictions Occupation;Other   sleep/self care   Stability/Clinical Decision Making  Stable/Uncomplicated    Clinical Decision Making Low  Rehab Potential Good    PT Frequency 2x / week   1-2x/wk   PT Duration 6 weeks    PT Treatment/Interventions ADLs/Self Care Home Management;Electrical Stimulation;Cryotherapy;Iontophoresis 4mg /ml Dexamethasone;Moist Heat;Therapeutic exercise;Therapeutic activities;Functional mobility training;Stair training;Gait training;Ultrasound;Neuromuscular re-education;Patient/family education;Manual techniques;Taping;Dry needling;Passive range of motion    PT Next Visit Plan DN, continue with hip strengthening/flexibility, Lt upper trap mobility    PT Home Exercise Plan Access Code: [email or web address]    Consulted and Agree with Plan of Care Patient           Patient will benefit from skilled therapeutic intervention in order to improve the following deficits and impairments:  Pain,Increased fascial restricitons,Increased muscle spasms,Impaired flexibility,Decreased strength,Hypomobility,Impaired UE functional use  Visit Diagnosis: Pain in right hip  Left shoulder pain, unspecified chronicity  Muscle weakness (generalized)     Problem List Patient Active Problem List   Diagnosis Date Noted  . Benign prostatic hyperplasia without urinary obstruction 12/19/2019  . Benign essential hypertension 12/19/2019  . Decreased testosterone level 12/19/2019  . Hyperlipidemia 12/19/2019  . Insomnia 12/19/2019  . Overweight 12/19/2019  . Type 2 diabetes mellitus without complication ([place]) 59/47/0761  . Vitamin D deficiency 12/19/2019  . Impotence 11/08/2018  . Gastroesophageal reflux disease 12/23/2014   Scot Jun, PT, DPT, OCS, ATC 01/24/20  4:26 PM    [facility] [address] [place], [place], [address] Phone: [phone]   Fax:  [phone]  Name: Blake Mcclure MRN: [id number] Date of Birth: 1951-01-07

## 2020-01-29 ENCOUNTER — Encounter: Payer: Medicare Other | Admitting: Rehabilitative and Restorative Service Providers"

## 2020-01-31 ENCOUNTER — Encounter: Payer: Medicare Other | Admitting: Rehabilitative and Restorative Service Providers"

## 2020-02-05 ENCOUNTER — Encounter: Payer: Medicare Other | Admitting: Rehabilitative and Restorative Service Providers"

## 2020-02-07 ENCOUNTER — Other Ambulatory Visit: Payer: Self-pay

## 2020-02-07 ENCOUNTER — Encounter: Payer: Self-pay | Admitting: Rehabilitative and Restorative Service Providers"

## 2020-02-07 ENCOUNTER — Ambulatory Visit (INDEPENDENT_AMBULATORY_CARE_PROVIDER_SITE_OTHER): Payer: Medicare Other | Admitting: Rehabilitative and Restorative Service Providers"

## 2020-02-07 DIAGNOSIS — R29898 Other symptoms and signs involving the musculoskeletal system: Secondary | ICD-10-CM

## 2020-02-07 DIAGNOSIS — M6281 Muscle weakness (generalized): Secondary | ICD-10-CM

## 2020-02-07 DIAGNOSIS — M25551 Pain in right hip: Secondary | ICD-10-CM

## 2020-02-07 DIAGNOSIS — M25512 Pain in left shoulder: Secondary | ICD-10-CM

## 2020-02-07 NOTE — Therapy (Addendum)
[place], [place], [address] Phone: [phone]   Fax:  [phone]  Physical Therapy Treatment/Discharge  Patient Details  Name: Blake Mcclure MRN: [id number] Date of Birth: 1950/08/09 Referring Provider (PT): Dr. Erlinda Hong   Encounter Date: 02/07/2020   PT End of Session - 02/07/20 1533    Visit Number 4    Number of Visits 12    Date for PT Re-Evaluation 02/18/20    Authorization Type Medicare/Tricare    Progress Note Due on Visit 12    PT Start Time 6979    PT Stop Time 1540    PT Time Calculation (min) 26 min    Activity Tolerance Patient tolerated treatment well    Behavior During Therapy [facility] for tasks assessed/performed           History reviewed. No pertinent past medical history.  History reviewed. No pertinent surgical history.  There were no vitals filed for this visit.   Subjective Assessment - 02/07/20 1519    Subjective Pt. indicated feeling pretty good reduction in symptoms overall.  Having some here and there but not to the level to need additional manual intervention today. GROC rated at +5 at this time.  Symptoms at worst 2/10 (shoulder).    Limitations Sitting    How long can you sit comfortably? getting up after sitting > 15-20 min    Diagnostic tests xrays Feb 2021: no arthritis    Patient Stated Goals improve pain    Currently in Pain? No/denies    Pain Score 0-No pain    Pain Onset More than a month ago              [facility] PT Assessment - 02/07/20 0001      Assessment   Medical Diagnosis M70.61 (ICD-10-CM) - Trochanteric bursitis, right hip, Lt shoulder    Referring Provider (PT) Dr. Erlinda Hong      Observation/Other Assessments   Focus on Therapeutic Outcomes (FOTO)  Hip updated: 70%      Strength   Right Hip Flexion 5/5    Right Hip Extension 5/5    Right Hip External Rotation  5/5    Right Hip Internal Rotation 5/5    Right Hip ABduction 5/5    Left Hip External Rotation 5/5                          [facility] Adult PT Treatment/Exercise - 02/07/20 0001      Knee/Hip Exercises: Stretches   Other Knee/Hip Stretches SKC 15 sec x 3 Rt, figure 4 piriformis stretch 15 sec x 3 Rt      Knee/Hip Exercises: Supine   Bridges 20 reps;Both      Shoulder Exercises: Standing   Extension Both   2 x 15 blue   Row Both   2 x 15 blue   Other Standing Exercises ER blue 2 x 15, bilateral      Shoulder Exercises: ROM/Strengthening   UBE (Upper Arm Bike) Lvl 3.5 2.5 mins alt fwd/back for 10 mins total                    PT Short Term Goals - 01/22/20 1529      PT SHORT TERM GOAL #1   Title Patient will demonstrate independent use of home exercise program to maintain progress from in clinic treatments.    Status Achieved    Target Date 01/21/20  PT Long Term Goals - 02/07/20 1527      PT LONG TERM GOAL #1   Title Patient will demonstrate/report pain at worst less than or equal to 2/10 to facilitate minimal limitation in daily activity secondary to pain symptoms.    Status Achieved      PT LONG TERM GOAL #2   Title Patient will demonstrate independent use of home exercise program to facilitate ability to maintain/progress functional gains from skilled physical therapy services.    Time 0    Status Achieved      PT LONG TERM GOAL #3   Title Demonstrate 5/5 Rt hip strength for improved function    Status Achieved      PT LONG TERM GOAL #4   Title report 50% decrease in stiffness upon waking in AM for improved function    Status Achieved      PT LONG TERM GOAL #5   Title FOTO score maintained for him to continue with regular and usual activity    Status On-going                 Plan - 02/07/20 1524    Clinical Impression Statement Pt. has attended 4 visits overall during course of treatment.  GROC +5 at this time c reduced symptoms to reach goal.  See objective data for updated information.  Pt. is appropriate for HEP  trial at this time c return prn based off symptoms.    Examination-Activity Limitations Sit;Bend;Squat;Locomotion Level;Sleep    Examination-Participation Restrictions Occupation;Other   sleep/self care   Stability/Clinical Decision Making Stable/Uncomplicated    Rehab Potential Good    PT Frequency 2x / week   1-2x/wk   PT Duration 6 weeks    PT Treatment/Interventions ADLs/Self Care Home Management;Electrical Stimulation;Cryotherapy;Iontophoresis 74m/ml Dexamethasone;Moist Heat;Therapeutic exercise;Therapeutic activities;Functional mobility training;Stair training;Gait training;Ultrasound;Neuromuscular re-education;Patient/family education;Manual techniques;Taping;Dry needling;Passive range of motion    PT Next Visit Plan PT on hold for HEP trial    PT Home Exercise Plan Access Code: [email or web address]   Consulted and Agree with Plan of Care Patient           Patient will benefit from skilled therapeutic intervention in order to improve the following deficits and impairments:  Pain,Increased fascial restricitons,Increased muscle spasms,Impaired flexibility,Decreased strength,Hypomobility,Impaired UE functional use  Visit Diagnosis: Pain in right hip  Left shoulder pain, unspecified chronicity  Muscle weakness (generalized)  Other symptoms and signs involving the musculoskeletal system     Problem List Patient Active Problem List   Diagnosis Date Noted  . Benign prostatic hyperplasia without urinary obstruction 12/19/2019  . Benign essential hypertension 12/19/2019  . Decreased testosterone level 12/19/2019  . Hyperlipidemia 12/19/2019  . Insomnia 12/19/2019  . Overweight 12/19/2019  . Type 2 diabetes mellitus without complication ([place] 149/35/5217 . Vitamin D deficiency 12/19/2019  . Impotence 11/08/2018  . Gastroesophageal reflux disease 12/23/2014   MScot Jun PT, DPT, OCS, ATC 02/07/20  3:39 PM   PHYSICAL THERAPY DISCHARGE SUMMARY  Visits from Start of Care:  4  Current functional level related to goals / functional outcomes: See note   Remaining deficits: See note   Education / Equipment: HEP Plan: Patient agrees to discharge.  Patient goals were partially met. Patient is being discharged due to not returning since the last visit.  ?????     MScot Jun PT, DPT, OCS, ATC 03/25/20  10:45 AM      [facility] [address] [address]Phone: [phone]  Fax:  [phone]  Name: Blake Mcclure MRN: [id number] Date of Birth: February 01, 1951

## 2020-02-27 ENCOUNTER — Encounter: Payer: Self-pay | Admitting: Orthopaedic Surgery

## 2020-02-27 ENCOUNTER — Ambulatory Visit (INDEPENDENT_AMBULATORY_CARE_PROVIDER_SITE_OTHER): Payer: Medicare Other | Admitting: Orthopaedic Surgery

## 2020-02-27 DIAGNOSIS — M25512 Pain in left shoulder: Secondary | ICD-10-CM

## 2020-02-27 DIAGNOSIS — M7061 Trochanteric bursitis, right hip: Secondary | ICD-10-CM | POA: Diagnosis not present

## 2020-02-27 DIAGNOSIS — Z9889 Other specified postprocedural states: Secondary | ICD-10-CM

## 2020-02-27 DIAGNOSIS — G8929 Other chronic pain: Secondary | ICD-10-CM | POA: Diagnosis not present

## 2020-02-27 NOTE — Progress Notes (Signed)
The patient is a 70 year old active gentleman who comes in for follow-up after having physical therapy on his left shoulder and his right hip.  He has a history of a left shoulder arthroscopy and some bursitis and tendinitis in the shoulder as well as tendinitis and bursitis of the right hip.  He says he was able to shovel his driveway of snow this past weekend.  He had therapy is doing great and he is doing excellent overall.  Examination of his left shoulder is basically normal today with excellent range of motion and strength.  Examination of his right hip is also basically normal today.  Of note he has been released by therapy.  From my standpoint he can follow-up as needed since he is doing well.  All question concerns were answered and addressed.

## 2020-04-24 ENCOUNTER — Other Ambulatory Visit: Payer: Self-pay

## 2020-04-24 ENCOUNTER — Encounter: Payer: Self-pay | Admitting: Podiatry

## 2020-04-24 ENCOUNTER — Ambulatory Visit (INDEPENDENT_AMBULATORY_CARE_PROVIDER_SITE_OTHER): Payer: Medicare Other | Admitting: Podiatry

## 2020-04-24 ENCOUNTER — Ambulatory Visit (INDEPENDENT_AMBULATORY_CARE_PROVIDER_SITE_OTHER): Payer: Medicare Other

## 2020-04-24 DIAGNOSIS — M79672 Pain in left foot: Secondary | ICD-10-CM | POA: Diagnosis not present

## 2020-04-24 DIAGNOSIS — B070 Plantar wart: Secondary | ICD-10-CM

## 2020-04-24 DIAGNOSIS — M79671 Pain in right foot: Secondary | ICD-10-CM

## 2020-04-24 DIAGNOSIS — M21619 Bunion of unspecified foot: Secondary | ICD-10-CM

## 2020-04-24 NOTE — Patient Instructions (Signed)
Bunion A bunion (hallux valgus) is a bump that forms slowly on the inner side of the big toe joint. It occurs when the big toe turns toward the second toe. Bunions may be small at first, but they often get larger over time. They can make walking painful. What are the causes? This condition may be caused by:  Wearing narrow or pointed shoes that force the big toe to press against the other toes.  Abnormal foot development that causes the foot to roll inward.  Changes in the foot that are caused by certain diseases, such as rheumatoid arthritis or polio.  A foot injury. What increases the risk? The following factors may make you more likely to develop this condition:  Wearing shoes that squeeze the toes together.  Having certain diseases, such as: ? Rheumatoid arthritis. ? Polio. ? Cerebral palsy.  Having family members who have bunions.  Being born with abnormally shaped feet (a foot deformity), such as flat feet or low arches.  Doing activities that put a lot of pressure on the feet, such as ballet dancing. What are the signs or symptoms? The main symptom of this condition is a bump on your big toe that you can notice. Other symptoms may include:  Pain.  Redness and inflammation around your big toe.  Thick or hardened skin on your big toe or between your toes.  Stiffness or loss of motion in your big toe.  Trouble with walking.   How is this diagnosed? This condition may be diagnosed based on your symptoms, medical history, and activities. You may also have tests and imaging, such as:  X-rays. These allow your health care provider to check the position of the bones in your foot and look for damage to your joint. They also help your health care provider determine the severity of your bunion and the best way to treat it.  Joint aspiration. In this test, a sample of fluid is removed from the toe joint. This test may be done if you are in a lot of pain. It helps rule out  diseases that cause painful swelling of the joints, such as arthritis or gout. How is this treated? Treatment depends on the severity of your symptoms. The goal of treatment is to relieve symptoms and prevent your bunion from getting worse. Your health care provider may recommend:  Wearing shoes that have a wide toe box, or using bunion pads to cushion the affected area.  Taping your toes together to keep them in a normal position.  Placing a device inside your shoe (orthotic device) to help reduce pressure on your toe joint.  Taking medicine to ease pain and inflammation.  Putting ice or heat on the affected area.  Doing stretching exercises.  Surgery, for severe cases. Follow these instructions at home: Managing pain, stiffness, and swelling  If directed, put ice on the painful area. To do this: ? Put ice in a plastic bag. ? Place a towel between your skin and the bag. ? Leave the ice on for 20 minutes, 2-3 times a day. ? Remove the ice if your skin turns bright red. This is very important. If you cannot feel pain, heat, or cold, you have a greater risk of damage to the area.  If directed, apply heat to the affected area before you exercise. Use the heat source that your health care provider recommends, such as a moist heat pack or a heating pad. ? Place a towel between your skin and the   heat source. ? Leave the heat on for 20-30 minutes. ? Remove the heat if your skin turns bright red. This is especially important if you are unable to feel pain, heat, or cold. You have a greater risk of getting burned.      General instructions  Do exercises as told by your health care provider.  Support your toe joint with proper footwear, shoe padding, or taping as told by your health care provider.  Take over-the-counter and prescription medicines only as told by your health care provider.  Do not use any products that contain nicotine or tobacco, such as cigarettes, e-cigarettes, and  chewing tobacco. If you need help quitting, ask your health care provider.  Keep all follow-up visits. This is important. Contact a health care provider if:  Your symptoms get worse.  Your symptoms do not improve in 2 weeks. Get help right away if:  You have severe pain and trouble with walking. Summary  A bunion is a bump on the inner side of the big toe joint that forms when the big toe turns toward the second toe.  Bunions can make walking painful.  Treatment depends on the severity of your symptoms.  Support your toe joint with proper footwear, shoe padding, or taping as told by your health care provider. This information is not intended to replace advice given to you by your health care provider. Make sure you discuss any questions you have with your health care provider. Document Revised: 06/01/2019 Document Reviewed: 06/01/2019 Elsevier Patient Education  2021 Elsevier Inc.  

## 2020-04-27 NOTE — Progress Notes (Signed)
Subjective:   Patient ID: Blake Mcclure, male   DOB: 70 y.o.   MRN: [id number]   HPI Patient presents with significant structural bunion deformity and also a lesion bottom right foot that is been painful for several months and at times makes it hard to walk.  Patient states the lesion did not seem to occur with any form of trauma but just appeared and patient does have a large deformity that he states does get sore and has tried wider shoes and other modalities.  Patient does not smoke likes to be active   Review of Systems  All other systems reviewed and are negative.       Objective:  Physical Exam Vitals and nursing note reviewed.  Constitutional:      Appearance: He is well-developed.  Pulmonary:     Effort: Pulmonary effort is normal.  Musculoskeletal:        General: Normal range of motion.  Skin:    General: Skin is warm.  Neurological:     Mental Status: He is alert.   Neurovascular status found to be intact to be adequate range of motion found to be adequate.  Patient has large bunion deformity right and left with deviation of the hallux and second toe redness to get around the joint and has a lesion subsecond metatarsal area that measures approximately 8 x 8 and has pain to lateral pressure pinpoint bleeding upon debridement.  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  Explained procedure for distal osteotomy and went ahead today and I topical agent to the lesion and explained what to do if blistering were to occur or any other pathology.  Reappoint for [place] to recheck  X-rays indicate there is structural bunion deformity bilateral with deviation of the hallux noted bilateral

## 2020-06-09 ENCOUNTER — Ambulatory Visit (INDEPENDENT_AMBULATORY_CARE_PROVIDER_SITE_OTHER): Payer: Medicare Other | Admitting: Physician Assistant

## 2020-06-09 ENCOUNTER — Ambulatory Visit (INDEPENDENT_AMBULATORY_CARE_PROVIDER_SITE_OTHER): Payer: Medicare Other

## 2020-06-09 ENCOUNTER — Encounter: Payer: Self-pay | Admitting: Physician Assistant

## 2020-06-09 DIAGNOSIS — M25511 Pain in right shoulder: Secondary | ICD-10-CM

## 2020-06-09 DIAGNOSIS — M542 Cervicalgia: Secondary | ICD-10-CM

## 2020-06-09 MED ORDER — LIDOCAINE HCL 1 % IJ SOLN
3.0000 mL | INTRAMUSCULAR | Status: AC | PRN
  Administered 2020-06-09: 3 mL

## 2020-06-09 MED ORDER — METHYLPREDNISOLONE ACETATE 40 MG/ML IJ SUSP
40.0000 mg | INTRAMUSCULAR | Status: AC | PRN
  Administered 2020-06-09: 40 mg via INTRA_ARTICULAR

## 2020-06-09 NOTE — Progress Notes (Signed)
Office Visit Note   Patient: Blake Mcclure           Date of Birth: September 30, 1950           MRN: [id number] Visit Date: 06/09/2020              Requested by: Willey Blade, [address] [place],  [place] [address] PCP: Willey Blade, MD   Assessment & Plan: Visit Diagnoses:  1. Right shoulder pain, unspecified chronicity   2. Neck pain     Plan: He will work on shoulder exercises as shown for the left shoulder in the past by PT.  See him back in 2 weeks if he continues to have pain in the right shoulder.  Questions encouraged and answered at length.  He will watch his glucose levels closely over the next 2 days as he understands that the cortisone can raise his glucose levels.  Follow-Up Instructions: No follow-ups on file.   Orders:  Orders Placed This Encounter  Procedures  . Large Joint Inj  . XR Shoulder Right  . XR Cervical Spine 2 or 3 views   No orders of the defined types were placed in this encounter.     Procedures: Large Joint Inj: R subacromial bursa on 06/09/2020 4:45 PM Indications: pain Details: 22 G 1.5 in needle, superior approach  Arthrogram: No  Medications: 3 mL lidocaine 1 %; 40 mg methylPREDNISolone acetate 40 MG/ML Outcome: tolerated well, no immediate complications Procedure, treatment alternatives, risks and benefits explained, specific risks discussed. Consent was given by the patient. Immediately prior to procedure a time out was called to verify the correct patient, procedure, equipment, support staff and site/side marked as required. Patient was prepped and draped in the usual sterile fashion.       Clinical Data: No additional findings.   Subjective: Chief Complaint  Patient presents with  . Right Shoulder - Pain  . Neck - Pain    HPI  Blake Mcclure is well-known to Dr. Ninfa Linden service comes in today with right shoulder pain no known injury.  He does state he has some neck pain.  He has tried Aleve and icy  hot without any real relief.  He has tried meloxicam which does help some.  No known injury.  Pains been ongoing for the past 6 to 8 weeks.  Last pain for his left shoulder and left shoulder is continuing to do well.  Patient is diabetic but reports good control.  Review of Systems Negative for fevers chills or ongoing infection.  Objective: Vital Signs: There were no vitals taken for this visit.  Physical Exam Constitutional:      Appearance: He is normal weight. He is not ill-appearing or diaphoretic.  Pulmonary:     Effort: Pulmonary effort is normal.  Neurological:     Mental Status: He is alert and oriented to person, place, and time.  Psychiatric:        Mood and Affect: Mood normal.     Ortho Exam Cervical spine good range of motion cervical spine without pain.  Negative Spurling's.  He is nontender along medial borders of both scapula.  Nontender of the cervical spinal column.  5-5 strength throughout the upper extremities against resistance.  Full motor bilateral hands and full sensation bilateral hands to light touch.  Upper extremities: Radial pulses are 2+ bilaterally equal symmetric.  He has 5 out of 5 strengths with external and internal rotation against resistance bilateral shoulders.  Empty  can test is negative bilaterally.  Positive impingement on the right negative on the left.  Abduction of the right arm across the chest causes from pain in the [facility] joint.  He has slight discomfort with palpation of the [facility] joint on the right only. Specialty Comments:  No specialty comments available.  Imaging: XR Cervical Spine 2 or 3 views  Result Date: 06/09/2020 Cervical spine AP and lateral views shows loss of lordotic curvature.  Degenerative changes at C3-C4 C4-C5 and C5-C6.  No spondylolisthesis.  No acute fractures or findings.  XR Shoulder Right  Result Date: 06/09/2020 AP, Y-view and axillary view right shoulder: No acute fractures.  Shoulders well located.  Slight AC joint  changes.  Otherwise glenohumeral joints well-maintained.  No bony abnormalities otherwise.    PMFS History: Patient Active Problem List   Diagnosis Date Noted  . Benign prostatic hyperplasia without urinary obstruction 12/19/2019  . Benign essential hypertension 12/19/2019  . Decreased testosterone level 12/19/2019  . Hyperlipidemia 12/19/2019  . Insomnia 12/19/2019  . Overweight 12/19/2019  . Type 2 diabetes mellitus without complication ([place]) 46/27/0350  . Vitamin D deficiency 12/19/2019  . Impotence 11/08/2018  . Gastroesophageal reflux disease 12/23/2014   History reviewed. No pertinent past medical history.  Family History  Problem Relation Age of Onset  . Healthy Mother   . Healthy Father     History reviewed. No pertinent surgical history. Social History   Occupational History  . Not on file  Tobacco Use  . Smoking status: Never Smoker  . Smokeless tobacco: Never Used  Vaping Use  . Vaping Use: Never used  Substance and Sexual Activity  . Alcohol use: Not Currently  . Drug use: Not on file  . Sexual activity: Not on file

## 2020-08-20 NOTE — Progress Notes (Signed)
GU Location of Tumor / Histology: Prostate  If Prostate Cancer, Gleason Score is (3 + 4), PSA (4.8), and Prostate volume (16.17g)  Blake Mcclure presented \ months ago with signs/symptoms of: \  Biopsies revealed: 07/25/20   Past/Anticipated interventions by urology, if any: \  Past/Anticipated interventions by medical oncology, if any: \  Weight changes, if any: no  IPSS Score: 1 SHIM Score:14  Bowel/Bladder complaints, if any: no   Nausea/Vomiting, if any: no  Pain issues, if any:  no  SAFETY ISSUES: Prior radiation? no Pacemaker/ICD? no Possible current pregnancy?  Is the patient on methotrexate? no  Current Complaints / other details:

## 2020-08-26 ENCOUNTER — Ambulatory Visit
Admission: RE | Admit: 2020-08-26 | Discharge: 2020-08-26 | Disposition: A | Discharge status: Home / Self Care | Payer: Medicare Other | Source: Ambulatory Visit | Attending: Radiation Oncology | Admitting: Radiation Oncology

## 2020-08-26 ENCOUNTER — Other Ambulatory Visit: Payer: Self-pay

## 2020-08-26 ENCOUNTER — Encounter: Payer: Self-pay | Admitting: Radiation Oncology

## 2020-08-26 DIAGNOSIS — C61 Malignant neoplasm of prostate: Secondary | ICD-10-CM | POA: Insufficient documentation

## 2020-08-26 NOTE — Progress Notes (Signed)
Radiation Oncology         [phone] ________________________________  Initial Outpatient Consultation - Conducted via Telephone due to current COVID-19 concerns for limiting patient exposure  Name: Blake Mcclure MRN: [id number]  Date: 08/26/2020  DOB: 1950/11/07  KD:TOIZTIW, Joelene Millin, MD  Lucas Mallow, MD   REFERRING PHYSICIAN: Lucas Mallow, MD  DIAGNOSIS: 70 y.o. gentleman with Stage T1c adenocarcinoma of the prostate with Gleason score of 3+4, and PSA of 4.18.    ICD-10-CM   1. Prostate cancer [facility])  C61       HISTORY OF PRESENT ILLNESS: Blake Mcclure is a 70 y.o. male with a diagnosis of prostate cancer. He was previously seen by Dr. Gloriann Loan in 2020 for an elevated PSA of 4.7. PSA returned to normal on repeat, so they opted for surveillance. More recently, he was noted to have an elevated PSA of 5.1 on 04/21/20 by his primary care physician, Dr. Karlton Lemon. Accordingly, he was referred back for evaluation in urology by Dr. Gloriann Loan on 06/19/20,  digital rectal examination was performed at that time revealing no nodules. Repeat PSA obtained at that time was down to 4.18. The patient proceeded to transrectal ultrasound with 12 biopsies of the prostate on 07/25/20.  The prostate volume measured 16.17 cc.  Out of 12 core biopsies, 6 were positive.  The maximum Gleason score was 3+4, and this was seen in the left mid. Additionally, Gleason 3+3 was seen in the left apex lateral, left base (small focus), left apex, right apex (small focus), and right apex lateral (small focus).  The patient reviewed the biopsy results with his urologist and he has kindly been referred today for discussion of potential radiation treatment options.   PREVIOUS RADIATION THERAPY: No  PAST MEDICAL HISTORY: No past medical history on file.    PAST SURGICAL HISTORY:No past surgical history on file.  FAMILY HISTORY:  Family History  Problem Relation Age of Onset   Healthy Mother    Healthy Father      SOCIAL HISTORY:  Social History   Socioeconomic History   Marital status: Married    Spouse name: Not on file   Number of children: Not on file   Years of education: Not on file   Highest education level: Not on file  Occupational History   Not on file  Tobacco Use   Smoking status: Never   Smokeless tobacco: Never  Vaping Use   Vaping Use: Never used  Substance and Sexual Activity   Alcohol use: Not Currently   Drug use: Not on file   Sexual activity: Not on file  Other Topics Concern   Not on file  Social History Narrative   Not on file   Social Determinants of Health   Financial Resource Strain: Not on file  Food Insecurity: Not on file  Transportation Needs: Not on file  Physical Activity: Not on file  Stress: Not on file  Social Connections: Not on file  Intimate Partner Violence: Not on file    ALLERGIES: Patient has no known allergies.  MEDICATIONS:  Current Outpatient Medications  Medication Sig Dispense Refill   amoxicillin (AMOXIL) 875 MG tablet Take 875 mg by mouth 2 (two) times daily.     ascorbic acid (VITAMIN C) 1000 MG tablet Vitamin C 1,000 mg tablet  Take by oral route.     aspirin 81 MG EC tablet Adult Aspirin Regimen 81 mg tablet,delayed release  Take 1 tablet every day by oral  route.     cetirizine (KLS ALLER-TEC) 10 MG tablet Aller-Tec 10 mg tablet  Take 1 tablet every day by oral route.     Cholecalciferol 25 MCG (1000 UT) tablet Vitamin D3 25 mcg (1,000 unit) tablet  Take by oral route.     Coenzyme Q10 100 MG capsule coenzyme Q10 100 mg capsule  1 capsule orally once a day     Eszopiclone 3 MG TABS eszopiclone 3 mg tablet (Patient not taking: Reported on 01/07/2020)     metFORMIN (GLUCOPHAGE) 500 MG tablet metformin 500 mg tablet     omeprazole (PRILOSEC) 40 MG capsule omeprazole 40 mg capsule,delayed release     rosuvastatin (CRESTOR) 20 MG tablet rosuvastatin 20 mg tablet  TAKE 1 TABLET DAILY     sildenafil (VIAGRA) 50 MG tablet  sildenafil 50 mg tablet     tadalafil (CIALIS) 20 MG tablet tadalafil 20 mg tablet  Take 1 tablet by oral route as directed.     valsartan (DIOVAN) 160 MG tablet Take 160 mg by mouth daily.     zolpidem (AMBIEN CR) 12.5 MG CR tablet zolpidem ER 12.5 mg tablet,extended release,multiphase  1 tablet orally at bedtime     No current facility-administered medications for this encounter.    REVIEW OF SYSTEMS:  On review of systems, the patient reports that he is doing well overall. He denies any chest pain, shortness of breath, cough, fevers, chills, night sweats, unintended weight changes. He denies any bowel disturbances, and denies abdominal pain, nausea or vomiting. He denies any new musculoskeletal or joint aches or pains. His IPSS was 1, indicating mild urinary symptoms. His SHIM was 14, indicating he has moderate erectile dysfunction. A complete review of systems is obtained and is otherwise negative.    PHYSICAL EXAM:  Wt Readings from Last 3 Encounters:  No data found for Wt   Temp Readings from Last 3 Encounters:  03/09/19 98.1 F (36.7 C) (Oral)   BP Readings from Last 3 Encounters:  01/16/20 (!) 158/88  06/06/19 (!) 146/79  03/09/19 (!) 180/96   Pulse Readings from Last 3 Encounters:  01/16/20 77  06/06/19 67  03/09/19 70    /10  Physical exam not performed in light of telephone consult visit format.   KPS = 100  100 - Normal; no complaints; no evidence of disease. 90   - Able to carry on normal activity; minor signs or symptoms of disease. 80   - Normal activity with effort; some signs or symptoms of disease. 30   - Cares for self; unable to carry on normal activity or to do active work. 60   - Requires occasional assistance, but is able to care for most of his personal needs. 50   - Requires considerable assistance and frequent medical care. 90   - Disabled; requires special care and assistance. 78   - Severely disabled; hospital admission is indicated although  death not imminent. 44   - Very sick; hospital admission necessary; active supportive treatment necessary. 10   - Moribund; fatal processes progressing rapidly. 0     - Dead  Karnofsky DA, Abelmann Apison, Craver LS and Burchenal [facility] [phone]) The use of the nitrogen mustards in the palliative treatment of carcinoma: with particular reference to bronchogenic carcinoma Cancer 1 634-56  LABORATORY DATA:  Lab Results  Component Value Date   WBC 4.4 12/21/2009   HGB 11.5 (L) 12/21/2009   HCT 35.5 (L) 12/21/2009   MCV 75.9 (L) 12/21/2009  PLT 247 12/21/2009   Lab Results  Component Value Date   NA 136 12/21/2009   K 4.3 12/21/2009   CL 101 12/21/2009   CO2 29 12/21/2009   No results found for: ALT, AST, GGT, ALKPHOS, BILITOT   RADIOGRAPHY: No results found.    IMPRESSION/PLAN: This visit was conducted via Telephone to spare the patient unnecessary potential exposure in the healthcare setting during the current COVID-19 pandemic. 1. 70 y.o. gentleman with Stage T1c adenocarcinoma of the prostate with Gleason Score of 3+4, and PSA of 4.18. We discussed the patient's workup and outlined the nature of prostate cancer in this setting. The patient's T stage, Gleason's score, and PSA put him into the favorable intermediate risk group. Accordingly, he is eligible for a variety of potential treatment options including brachytherapy, 5.5 weeks of external radiation, or prostatectomy. We discussed the available radiation techniques, and focused on the details and logistics and delivery. The patient may not be an ideal candidate for brachytherapy with a prostate volume of 16 cc. We discussed and outlined the risks, benefits, short and long-term effects associated with radiotherapy and compared and contrasted these with prostatectomy. We discussed the role of SpaceOAR in reducing the rectal toxicity associated with radiotherapy. He was encouraged to ask questions that were answered to his stated  satisfaction.  At the end of the conversation the patient is interested in moving forward with 5.5 weeks of external beam therapy. We will share our discussion with Dr. Gloriann Loan and make arrangements for fiducial markers and SpaceOAR gel placement, first available, prior to simulation, to reduce rectal toxicity from radiotherapy. The patient appears to have a good understanding of his disease and our treatment recommendations which are of curative intent and is in agreement with the stated plan.  Therefore, we will move forward with treatment planning accordingly, in anticipation of beginning IMRT in the near future.  We enjoyed meeting him today and look forward to participating in his care.  Given current concerns for patient exposure during the COVID-19 pandemic, this encounter was conducted via telephone. The patient was notified in advance and was offered a MyChart meeting to allow for face to face communication but unfortunately reported that he did not have the appropriate resources/technology to support such a visit and instead preferred to proceed with telephone consult. The patient has given verbal consent for this type of encounter. The attendants for this meeting include Tyler Pita MD, Ashlyn Bruning PA-C, and patient, Jaleal Schliep Dunphy. During the encounter, Tyler Pita MD and Freeman Caldron PA-C were located at [facility].  Patient, Guinn Delarosa Geraci was located at home.  We personally spent 60 minutes in this encounter including chart review, reviewing radiological studies, meeting face-to-face with the patient, entering orders and completing documentation.   Nicholos Johns, PA-C    Tyler Pita, MD  [facility] Direct Dial: [phone]  Fax: [phone] [email or web address]  Skype  [email or web address]  This document serves as a record of services personally performed by Tyler Pita, MD and Freeman Caldron,  PA-C. It was created on their behalf by Wilburn Mylar, a trained medical scribe. The creation of this record is based on the scribe's personal observations and the provider's statements to them. This document has been checked and approved by the attending provider.

## 2020-08-27 ENCOUNTER — Telehealth: Payer: Self-pay | Admitting: *Deleted

## 2020-08-27 NOTE — Telephone Encounter (Signed)
CALLED PATIENT TO INFORM OF FID. MARKERS AND SPACE OAR PLACED ON 09-25-20 @ [facility] AND HIS SIM ON 09-30-20 @ 9 AM @ [place], SPOKE WITH PATIENT AND HE IS AWARE OF THESE APPTS.

## 2020-09-26 ENCOUNTER — Telehealth: Payer: Self-pay | Admitting: *Deleted

## 2020-09-26 NOTE — Telephone Encounter (Signed)
CALLED PATIENT TO REMIND OF SIM APPT. FOR 09-30-20- ARRIVAL TIME- 8:45 AM @ [facility], SPOKE WITH PATIENT AND HE IS AWARE OF THIS APPT.

## 2020-09-29 ENCOUNTER — Telehealth: Payer: Self-pay | Admitting: *Deleted

## 2020-09-29 NOTE — Telephone Encounter (Signed)
CALLED PATIENT TO REMIND OF SIM APPT. FOR 09-30-20- ARRIVAL TIME- 8:45 AM @ [facility], SPOKE WITH PATIENT AND HE IS AWARE OF THIS  APPT.

## 2020-09-30 ENCOUNTER — Other Ambulatory Visit: Payer: Self-pay

## 2020-09-30 ENCOUNTER — Ambulatory Visit
Admission: RE | Admit: 2020-09-30 | Discharge: 2020-09-30 | Disposition: A | Discharge status: Home / Self Care | Payer: Medicare Other | Source: Ambulatory Visit | Attending: Radiation Oncology | Admitting: Radiation Oncology

## 2020-09-30 DIAGNOSIS — C61 Malignant neoplasm of prostate: Secondary | ICD-10-CM | POA: Insufficient documentation

## 2020-09-30 DIAGNOSIS — Z510 Encounter for antineoplastic radiation therapy: Secondary | ICD-10-CM | POA: Diagnosis not present

## 2020-09-30 NOTE — Progress Notes (Signed)
  Radiation Oncology         [phone] ________________________________  Name: Blake Mcclure MRN: [id number]  Date: 09/30/2020  DOB: May 21, 1950  SIMULATION AND TREATMENT PLANNING NOTE    ICD-10-CM   1. Malignant neoplasm of prostate ([place])  C61     2. Prostate cancer ([place])  C61       DIAGNOSIS:  70 y.o. gentleman with Stage T1c adenocarcinoma of the prostate with Gleason score of 3+4, and PSA of 4.18.  NARRATIVE:  The patient was brought to the [facility].  Identity was confirmed.  All relevant records and images related to the planned course of therapy were reviewed.  The patient freely provided informed written consent to proceed with treatment after reviewing the details related to the planned course of therapy. The consent form was witnessed and verified by the simulation staff.  Then, the patient was set-up in a stable reproducible supine position for radiation therapy.  A vacuum lock pillow device was custom fabricated to position his legs in a reproducible immobilized position.  Then, I performed a urethrogram under sterile conditions to identify the prostatic apex.  CT images were obtained.  Surface markings were placed.  The CT images were loaded into the planning software.  Then the prostate target and avoidance structures including the rectum, bladder, bowel and hips were contoured.  Treatment planning then occurred.  The radiation prescription was entered and confirmed.  A total of one complex treatment devices was fabricated. I have requested : Intensity Modulated Radiotherapy (IMRT) is medically necessary for this case for the following reason:  Rectal sparing.Marland Kitchen  PLAN:  The patient will receive 70 Gy in 28 fractions.  ________________________________  Sheral Apley Tammi Klippel, M.D.

## 2020-10-07 DIAGNOSIS — Z510 Encounter for antineoplastic radiation therapy: Secondary | ICD-10-CM | POA: Diagnosis not present

## 2020-10-09 ENCOUNTER — Ambulatory Visit
Admission: RE | Admit: 2020-10-09 | Discharge: 2020-10-09 | Disposition: A | Discharge status: Home / Self Care | Payer: Medicare Other | Source: Ambulatory Visit | Attending: Radiation Oncology | Admitting: Radiation Oncology

## 2020-10-09 DIAGNOSIS — C61 Malignant neoplasm of prostate: Secondary | ICD-10-CM | POA: Diagnosis not present

## 2020-10-09 DIAGNOSIS — Z510 Encounter for antineoplastic radiation therapy: Secondary | ICD-10-CM | POA: Insufficient documentation

## 2020-10-10 ENCOUNTER — Ambulatory Visit
Admission: RE | Admit: 2020-10-10 | Discharge: 2020-10-10 | Disposition: A | Discharge status: Home / Self Care | Payer: Medicare Other | Source: Ambulatory Visit | Attending: Radiation Oncology | Admitting: Radiation Oncology

## 2020-10-10 ENCOUNTER — Other Ambulatory Visit: Payer: Self-pay

## 2020-10-10 DIAGNOSIS — Z510 Encounter for antineoplastic radiation therapy: Secondary | ICD-10-CM | POA: Diagnosis not present

## 2020-10-10 NOTE — Progress Notes (Signed)
Pt here for patient teaching. Pt given Radiation and You booklet and skin care instructions.  Reviewed areas of pertinence such as diarrhea, fatigue, hair loss, nausea and vomiting, sexual and fertility changes, skin changes, throat changes, and urinary and bladder changes . Pt able to give teach back of to pat skin, use unscented/gentle soap, have Imodium on hand, and drink plenty of water,avoid applying anything to skin within 4 hours of treatment. Pt verbalizes understanding of information given and will contact nursing with any questions or concerns.     [email or web address]

## 2020-10-14 ENCOUNTER — Ambulatory Visit
Admission: RE | Admit: 2020-10-14 | Discharge: 2020-10-14 | Disposition: A | Discharge status: Home / Self Care | Payer: Medicare Other | Source: Ambulatory Visit | Attending: Radiation Oncology | Admitting: Radiation Oncology

## 2020-10-14 ENCOUNTER — Other Ambulatory Visit: Payer: Self-pay

## 2020-10-14 DIAGNOSIS — Z510 Encounter for antineoplastic radiation therapy: Secondary | ICD-10-CM | POA: Diagnosis not present

## 2020-10-15 ENCOUNTER — Ambulatory Visit
Admission: RE | Admit: 2020-10-15 | Discharge: 2020-10-15 | Disposition: A | Discharge status: Home / Self Care | Payer: Medicare Other | Source: Ambulatory Visit | Attending: Radiation Oncology | Admitting: Radiation Oncology

## 2020-10-15 ENCOUNTER — Other Ambulatory Visit: Payer: Self-pay

## 2020-10-15 DIAGNOSIS — Z510 Encounter for antineoplastic radiation therapy: Secondary | ICD-10-CM | POA: Diagnosis not present

## 2020-10-16 ENCOUNTER — Ambulatory Visit
Admission: RE | Admit: 2020-10-16 | Discharge: 2020-10-16 | Disposition: A | Discharge status: Home / Self Care | Payer: Medicare Other | Source: Ambulatory Visit | Attending: Radiation Oncology | Admitting: Radiation Oncology

## 2020-10-16 DIAGNOSIS — Z510 Encounter for antineoplastic radiation therapy: Secondary | ICD-10-CM | POA: Diagnosis not present

## 2020-10-17 ENCOUNTER — Ambulatory Visit
Admission: RE | Admit: 2020-10-17 | Discharge: 2020-10-17 | Disposition: A | Discharge status: Home / Self Care | Payer: Medicare Other | Source: Ambulatory Visit | Attending: Radiation Oncology | Admitting: Radiation Oncology

## 2020-10-17 ENCOUNTER — Other Ambulatory Visit: Payer: Self-pay

## 2020-10-17 DIAGNOSIS — Z510 Encounter for antineoplastic radiation therapy: Secondary | ICD-10-CM | POA: Diagnosis not present

## 2020-10-20 ENCOUNTER — Other Ambulatory Visit: Payer: Self-pay

## 2020-10-20 ENCOUNTER — Ambulatory Visit
Admission: RE | Admit: 2020-10-20 | Discharge: 2020-10-20 | Disposition: A | Discharge status: Home / Self Care | Payer: Medicare Other | Source: Ambulatory Visit | Attending: Radiation Oncology | Admitting: Radiation Oncology

## 2020-10-20 DIAGNOSIS — Z510 Encounter for antineoplastic radiation therapy: Secondary | ICD-10-CM | POA: Diagnosis not present

## 2020-10-21 ENCOUNTER — Other Ambulatory Visit: Payer: Self-pay

## 2020-10-21 ENCOUNTER — Ambulatory Visit
Admission: RE | Admit: 2020-10-21 | Discharge: 2020-10-21 | Disposition: A | Discharge status: Home / Self Care | Payer: Medicare Other | Source: Ambulatory Visit | Attending: Radiation Oncology | Admitting: Radiation Oncology

## 2020-10-21 DIAGNOSIS — Z510 Encounter for antineoplastic radiation therapy: Secondary | ICD-10-CM | POA: Diagnosis not present

## 2020-10-22 ENCOUNTER — Ambulatory Visit
Admission: RE | Admit: 2020-10-22 | Discharge: 2020-10-22 | Disposition: A | Discharge status: Home / Self Care | Payer: Medicare Other | Source: Ambulatory Visit | Attending: Radiation Oncology | Admitting: Radiation Oncology

## 2020-10-22 DIAGNOSIS — Z510 Encounter for antineoplastic radiation therapy: Secondary | ICD-10-CM | POA: Diagnosis not present

## 2020-10-23 ENCOUNTER — Ambulatory Visit
Admission: RE | Admit: 2020-10-23 | Discharge: 2020-10-23 | Disposition: A | Discharge status: Home / Self Care | Payer: Medicare Other | Source: Ambulatory Visit | Attending: Radiation Oncology | Admitting: Radiation Oncology

## 2020-10-23 DIAGNOSIS — Z510 Encounter for antineoplastic radiation therapy: Secondary | ICD-10-CM | POA: Diagnosis not present

## 2020-10-24 ENCOUNTER — Other Ambulatory Visit: Payer: Self-pay

## 2020-10-24 ENCOUNTER — Ambulatory Visit
Admission: RE | Admit: 2020-10-24 | Discharge: 2020-10-24 | Disposition: A | Discharge status: Home / Self Care | Payer: Medicare Other | Source: Ambulatory Visit | Attending: Radiation Oncology | Admitting: Radiation Oncology

## 2020-10-24 DIAGNOSIS — Z510 Encounter for antineoplastic radiation therapy: Secondary | ICD-10-CM | POA: Diagnosis not present

## 2020-10-27 ENCOUNTER — Encounter: Payer: Self-pay | Admitting: Physician Assistant

## 2020-10-27 ENCOUNTER — Ambulatory Visit
Admission: RE | Admit: 2020-10-27 | Discharge: 2020-10-27 | Disposition: A | Discharge status: Home / Self Care | Payer: Medicare Other | Source: Ambulatory Visit | Attending: Radiation Oncology | Admitting: Radiation Oncology

## 2020-10-27 ENCOUNTER — Ambulatory Visit (INDEPENDENT_AMBULATORY_CARE_PROVIDER_SITE_OTHER): Payer: Medicare Other

## 2020-10-27 ENCOUNTER — Ambulatory Visit (INDEPENDENT_AMBULATORY_CARE_PROVIDER_SITE_OTHER): Payer: Medicare Other | Admitting: Physician Assistant

## 2020-10-27 DIAGNOSIS — G8929 Other chronic pain: Secondary | ICD-10-CM

## 2020-10-27 DIAGNOSIS — M545 Low back pain, unspecified: Secondary | ICD-10-CM

## 2020-10-27 DIAGNOSIS — Z510 Encounter for antineoplastic radiation therapy: Secondary | ICD-10-CM | POA: Diagnosis not present

## 2020-10-27 MED ORDER — CYCLOBENZAPRINE HCL 10 MG PO TABS: 10.0000 mg | ORAL_TABLET | Freq: Every day | ORAL | 1 refills | Status: DC

## 2020-10-27 NOTE — Progress Notes (Signed)
Office Visit Note   Patient: Blake Mcclure           Date of Birth: 1951-01-30           MRN: Blake Mcclure Visit Date: 10/27/2020              Requested by: Blake Mcclure, [place],  [place] [id number] PCP: Blake Mcclure   Assessment & Plan: Visit Diagnoses:  1. Chronic low back pain, unspecified back pain laterality, unspecified whether sciatica present     Plan: We will send him for repeat epidural steroid injection lumbar spine.  Having follow-up with [place] 4 weeks after the injection and see how he is doing overall.  Also sent a muscle relaxant for him to take at night.  Questions were encouraged and answered at length.  Follow-Up Instructions: Return in about 4 weeks (around 11/24/2020).   Orders:  Orders Placed This Encounter  Procedures   XR Lumbar Spine 2-3 Views   Meds ordered this encounter  Medications   cyclobenzaprine (FLEXERIL) 10 MG tablet    Sig: Take 1 tablet (10 mg total) by mouth at bedtime.    Dispense:  20 tablet    Refill:  1      Procedures: No procedures performed   Clinical Data: No additional findings.   Subjective: Chief Complaint  Patient presents with   Lower Back - Follow-up    HPI [name] returns today due to low back pain.  He states pains been ongoing for the past 2 months.  He had prior back pain with some radicular symptoms down the right leg which he underwent an epidural steroid injection by Dr. Ernestina Mcclure in April 2021 this was a paramedian L5-S1 injection.  He got good relief with this.  He denies any radicular symptoms down the right leg today.  Most pains in the low back mostly on the right side.  States pain is 10 out of 10 at worst.  Pain is worse first thing in the morning.  He does take Mobic which helps some.  Said no fevers chills.  He denies any bowel bladder dysfunction saddle anesthesia like symptoms or waking pain.  He is diabetic hemoglobin A1c most recently was 6.4. He  has tried therapy and past for his back and his in the past this is exacerbating his pain. Review of Systems See HPI.  Objective: Vital Signs: There were no vitals taken for this visit.  Physical Exam Constitutional:      Appearance: He is normal weight. He is not ill-appearing or diaphoretic.  Pulmonary:     Effort: Pulmonary effort is normal.  Neurological:     Mental Status: He is alert and oriented to person, place, and time.  Psychiatric:        Mood and Affect: Mood normal.    Ortho Exam Positive straight leg raise on the right negative on the left.  5 out of 5 strength throughout lower extremities against resistance.  Dorsal pedal pulses are 2+ bilaterally equal symmetric.  Full sensation bilateral feet to light touch.  Forward flexion lumbar spine causes some discomfort knees unable to touch his toes feeling within 3 inches of being able to do this.  Has some decreased extension of the lumbar spine and increased pain. Specialty Comments:  No specialty comments available.  Imaging: XR Lumbar Spine 2-3 Views  Result Date: 10/27/2020 Lumbar spine 2 views: No acute fractures.  Grade 1 spondylolisthesis  at L5-S1 remains unchanged from prior films.  Disc base overall well-maintained.  Minimal degenerative changes throughout the spine.  Normal lordotic curvature.    PMFS History: Patient Active Problem List   Diagnosis Date Noted   Prostate cancer ([place]) 08/26/2020   Benign prostatic hyperplasia without urinary obstruction 12/19/2019   Benign essential hypertension 12/19/2019   Decreased testosterone level 12/19/2019   Hyperlipidemia 12/19/2019   Insomnia 12/19/2019   Overweight 12/19/2019   Type 2 diabetes mellitus without complication ([place]) 123456   Vitamin D deficiency 12/19/2019   Impotence 11/08/2018   Gastroesophageal reflux disease 12/23/2014   History reviewed. No pertinent past medical history.  Family History  Problem Relation Age of Onset   Healthy Mother     Healthy Father     History reviewed. No pertinent surgical history. Social History   Occupational History   Not on file  Tobacco Use   Smoking status: Never   Smokeless tobacco: Never  Vaping Use   Vaping Use: Never used  Substance and Sexual Activity   Alcohol use: Not Currently   Drug use: Not on file   Sexual activity: Not on file

## 2020-10-28 ENCOUNTER — Ambulatory Visit
Admission: RE | Admit: 2020-10-28 | Discharge: 2020-10-28 | Disposition: A | Discharge status: Home / Self Care | Payer: Medicare Other | Source: Ambulatory Visit | Attending: Radiation Oncology | Admitting: Radiation Oncology

## 2020-10-28 ENCOUNTER — Other Ambulatory Visit: Payer: Self-pay

## 2020-10-28 DIAGNOSIS — M545 Low back pain, unspecified: Secondary | ICD-10-CM

## 2020-10-28 DIAGNOSIS — G8929 Other chronic pain: Secondary | ICD-10-CM

## 2020-10-28 DIAGNOSIS — Z510 Encounter for antineoplastic radiation therapy: Secondary | ICD-10-CM | POA: Diagnosis not present

## 2020-10-29 ENCOUNTER — Ambulatory Visit
Admission: RE | Admit: 2020-10-29 | Discharge: 2020-10-29 | Disposition: A | Discharge status: Home / Self Care | Payer: Medicare Other | Source: Ambulatory Visit | Attending: Radiation Oncology | Admitting: Radiation Oncology

## 2020-10-29 DIAGNOSIS — Z510 Encounter for antineoplastic radiation therapy: Secondary | ICD-10-CM | POA: Diagnosis not present

## 2020-10-30 ENCOUNTER — Ambulatory Visit
Admission: RE | Admit: 2020-10-30 | Discharge: 2020-10-30 | Disposition: A | Discharge status: Home / Self Care | Payer: Medicare Other | Source: Ambulatory Visit | Attending: Radiation Oncology | Admitting: Radiation Oncology

## 2020-10-30 DIAGNOSIS — Z510 Encounter for antineoplastic radiation therapy: Secondary | ICD-10-CM | POA: Diagnosis not present

## 2020-10-31 ENCOUNTER — Ambulatory Visit
Admission: RE | Admit: 2020-10-31 | Discharge: 2020-10-31 | Disposition: A | Discharge status: Home / Self Care | Payer: Medicare Other | Source: Ambulatory Visit | Attending: Radiation Oncology | Admitting: Radiation Oncology

## 2020-10-31 ENCOUNTER — Other Ambulatory Visit: Payer: Self-pay

## 2020-10-31 DIAGNOSIS — Z510 Encounter for antineoplastic radiation therapy: Secondary | ICD-10-CM | POA: Diagnosis not present

## 2020-11-03 ENCOUNTER — Ambulatory Visit
Admission: RE | Admit: 2020-11-03 | Discharge: 2020-11-03 | Disposition: A | Discharge status: Home / Self Care | Payer: Medicare Other | Source: Ambulatory Visit | Attending: Radiation Oncology | Admitting: Radiation Oncology

## 2020-11-03 DIAGNOSIS — Z510 Encounter for antineoplastic radiation therapy: Secondary | ICD-10-CM | POA: Diagnosis not present

## 2020-11-04 ENCOUNTER — Ambulatory Visit
Admission: RE | Admit: 2020-11-04 | Discharge: 2020-11-04 | Disposition: A | Discharge status: Home / Self Care | Payer: Medicare Other | Source: Ambulatory Visit | Attending: Radiation Oncology | Admitting: Radiation Oncology

## 2020-11-04 DIAGNOSIS — Z510 Encounter for antineoplastic radiation therapy: Secondary | ICD-10-CM | POA: Diagnosis not present

## 2020-11-05 ENCOUNTER — Other Ambulatory Visit: Payer: Self-pay

## 2020-11-05 ENCOUNTER — Encounter: Payer: Self-pay | Admitting: Physical Medicine and Rehabilitation

## 2020-11-05 ENCOUNTER — Ambulatory Visit
Admission: RE | Admit: 2020-11-05 | Discharge: 2020-11-05 | Disposition: A | Discharge status: Home / Self Care | Payer: Medicare Other | Source: Ambulatory Visit | Attending: Radiation Oncology | Admitting: Radiation Oncology

## 2020-11-05 ENCOUNTER — Ambulatory Visit (INDEPENDENT_AMBULATORY_CARE_PROVIDER_SITE_OTHER): Payer: Medicare Other | Admitting: Physical Medicine and Rehabilitation

## 2020-11-05 ENCOUNTER — Ambulatory Visit: Payer: Self-pay

## 2020-11-05 VITALS — BP 112/70 | HR 92

## 2020-11-05 DIAGNOSIS — M5416 Radiculopathy, lumbar region: Secondary | ICD-10-CM | POA: Diagnosis not present

## 2020-11-05 DIAGNOSIS — Z510 Encounter for antineoplastic radiation therapy: Secondary | ICD-10-CM | POA: Diagnosis not present

## 2020-11-05 MED ORDER — BETAMETHASONE SOD PHOS & ACET 6 (3-3) MG/ML IJ SUSP
12.0000 mg | Freq: Once | INTRAMUSCULAR | Status: AC
  Administered 2020-11-05: 12 mg

## 2020-11-05 NOTE — Patient Instructions (Signed)

## 2020-11-05 NOTE — Progress Notes (Signed)
Pt state lower back pain. Pt state when getting up out of the bed in the morning the pain is worse. Pt state when he sitting for long period of time the pain gets worse. Pt state he takes pain meds to help ease his pain.  Numeric Pain Rating Scale and Functional Assessment Average Pain 1 9  In the last MONTH (on 0-10 scale) has pain interfered with the following?  1. General activity like being  able to carry out your everyday physical activities such as walking, climbing stairs, carrying groceries, or moving a chair?  Rating(10)   +Driver, -BT, -Dye Allergies.

## 2020-11-06 ENCOUNTER — Other Ambulatory Visit: Payer: Self-pay

## 2020-11-06 ENCOUNTER — Ambulatory Visit
Admission: RE | Admit: 2020-11-06 | Discharge: 2020-11-06 | Disposition: A | Discharge status: Home / Self Care | Payer: Medicare Other | Source: Ambulatory Visit | Attending: Radiation Oncology | Admitting: Radiation Oncology

## 2020-11-06 DIAGNOSIS — Z510 Encounter for antineoplastic radiation therapy: Secondary | ICD-10-CM | POA: Diagnosis not present

## 2020-11-07 ENCOUNTER — Ambulatory Visit
Admission: RE | Admit: 2020-11-07 | Discharge: 2020-11-07 | Disposition: A | Discharge status: Home / Self Care | Payer: Medicare Other | Source: Ambulatory Visit | Attending: Radiation Oncology | Admitting: Radiation Oncology

## 2020-11-07 DIAGNOSIS — Z510 Encounter for antineoplastic radiation therapy: Secondary | ICD-10-CM | POA: Diagnosis not present

## 2020-11-09 NOTE — Procedures (Signed)
Lumbar Epidural Steroid Injection - Interlaminar Approach with Fluoroscopic Guidance  Patient: Blake Mcclure      Date of Birth: 08/25/1950 MRN: [id number] PCP: Willey Blade, MD      Visit Date: 11/05/2020   Universal Protocol:     Consent Given By: the patient  Position: PRONE  Additional Comments: Vital signs were monitored before and after the procedure. Patient was prepped and draped in the usual sterile fashion. The correct patient, procedure, and site was verified.   Injection Procedure Details:   Procedure diagnoses: Lumbar radiculopathy [M54.16]   Meds Administered:  Meds ordered this encounter  Medications   betamethasone acetate-betamethasone sodium phosphate (CELESTONE) injection 12 mg     Laterality: Right  Location/Site:  L5-S1  Needle: 3.5 in., 20 ga. Tuohy  Needle Placement: Paramedian epidural  Findings:   -Comments: Excellent flow of contrast into the epidural space.  Procedure Details: Using a paramedian approach from the side mentioned above, the region overlying the inferior lamina was localized under fluoroscopic visualization and the soft tissues overlying this structure were infiltrated with 4 ml. of 1% Lidocaine without Epinephrine. The Tuohy needle was inserted into the epidural space using a paramedian approach.   The epidural space was localized using loss of resistance along with counter oblique bi-planar fluoroscopic views.  After negative aspirate for air, blood, and CSF, a 2 ml. volume of Isovue-250 was injected into the epidural space and the flow of contrast was observed. Radiographs were obtained for documentation purposes.    The injectate was administered into the level noted above.   Additional Comments:  The patient tolerated the procedure well Dressing: 2 x 2 sterile gauze and Band-Aid    Post-procedure details: Patient was observed during the procedure. Post-procedure instructions were reviewed.  Patient left  the clinic in stable condition.

## 2020-11-09 NOTE — Progress Notes (Signed)
Blake Mcclure - 70 y.o. male MRN [id number]  Date of birth: 1950/12/11  Office Visit Note: Visit Date: 11/05/2020 PCP: Willey Blade, MD Referred by: Willey Blade, MD  Subjective: Chief Complaint  Patient presents with   Lower Back - Pain   HPI:  Blake Mcclure is a 70 y.o. male who comes in today at the request of Benita Stabile, PA-C for planned Right L5-S1 Lumbar Interlaminar epidural steroid injection with fluoroscopic guidance.  The patient has failed conservative care including home exercise, medications, time and activity modification.  This injection will be diagnostic and hopefully therapeutic.  Please see requesting physician notes for further details and justification.   ROS Otherwise per HPI.  Assessment & Plan: Visit Diagnoses:    ICD-10-CM   1. Lumbar radiculopathy  M54.16 XR C-ARM NO REPORT    Epidural Steroid injection    betamethasone acetate-betamethasone sodium phosphate (CELESTONE) injection 12 mg      Plan: No additional findings.   Meds & Orders:  Meds ordered this encounter  Medications   betamethasone acetate-betamethasone sodium phosphate (CELESTONE) injection 12 mg    Orders Placed This Encounter  Procedures   XR C-ARM NO REPORT   Epidural Steroid injection    Follow-up: Return if symptoms worsen or fail to improve.   Procedures: No procedures performed  Lumbar Epidural Steroid Injection - Interlaminar Approach with Fluoroscopic Guidance  Patient: Blake Mcclure      Date of Birth: [birth year] MRN: [id number] PCP: Willey Blade, MD      Visit Date: 11/05/2020   Universal Protocol:     Consent Given By: the patient  Position: PRONE  Additional Comments: Vital signs were monitored before and after the procedure. Patient was prepped and draped in the usual sterile fashion. The correct patient, procedure, and site was verified.   Injection Procedure Details:   Procedure diagnoses: Lumbar radiculopathy [M54.16]    Meds Administered:  Meds ordered this encounter  Medications   betamethasone acetate-betamethasone sodium phosphate (CELESTONE) injection 12 mg     Laterality: Right  Location/Site:  L5-S1  Needle: 3.5 in., 20 ga. Tuohy  Needle Placement: Paramedian epidural  Findings:   -Comments: Excellent flow of contrast into the epidural space.  Procedure Details: Using a paramedian approach from the side mentioned above, the region overlying the inferior lamina was localized under fluoroscopic visualization and the soft tissues overlying this structure were infiltrated with 4 ml. of 1% Lidocaine without Epinephrine. The Tuohy needle was inserted into the epidural space using a paramedian approach.   The epidural space was localized using loss of resistance along with counter oblique bi-planar fluoroscopic views.  After negative aspirate for air, blood, and CSF, a 2 ml. volume of Isovue-250 was injected into the epidural space and the flow of contrast was observed. Radiographs were obtained for documentation purposes.    The injectate was administered into the level noted above.   Additional Comments:  The patient tolerated the procedure well Dressing: 2 x 2 sterile gauze and Band-Aid    Post-procedure details: Patient was observed during the procedure. Post-procedure instructions were reviewed.  Patient left the clinic in stable condition.    Clinical History: MRI LUMBAR SPINE WITHOUT CONTRAST   TECHNIQUE: Multiplanar, multisequence MR imaging of the lumbar spine was performed. No intravenous contrast was administered.   COMPARISON:  None.   FINDINGS: Segmentation:  Standard   Alignment:  Grade 1 anterolisthesis at L5-S1   Vertebrae:  No fracture, evidence of discitis, or bone  lesion.   Conus medullaris and cauda equina: Conus extends to the L2 level. Conus and cauda equina appear normal.   Paraspinal and other soft tissues: Negative   Disc levels:   T12-L1: Normal  disc space and facet joints. There is no spinal canal stenosis. No neural foraminal stenosis.   L1-L2: Normal disc space and facet joints. There is no spinal canal stenosis. No neural foraminal stenosis.   L2-L3: Normal disc space and facet joints. There is no spinal canal stenosis. No neural foraminal stenosis.   L3-L4: Diffuse disc bulge with mild facet hypertrophy. There is no spinal canal stenosis. Moderate left neural foraminal stenosis.   L4-L5: Diffuse disc bulge. Mild facet hypertrophy. Mild spinal canal stenosis. Severe right and moderate left neural foraminal stenosis.   L5-S1: Severe facet hypertrophy with grade 1 anterolisthesis. Disc uncovering. Narrowing of both lateral recesses without central spinal canal stenosis. Severe bilateral neural foraminal stenosis.   Visualized sacrum: Normal.   IMPRESSION: 1. L5-S1 grade 1 anterolisthesis secondary to severe facet arthrosis. There is narrowing of both lateral recesses and severe bilateral neural foraminal stenosis. 2. L4-L5 mild spinal canal stenosis with severe right and moderate left neural foraminal stenosis. 3. L3-L4 moderate left neural foraminal stenosis.     Electronically Signed   By: Ulyses Jarred M.D.   On: 05/13/2019 04:46     Objective:  VS:  HT:    WT:   BMI:     BP:112/70  HR:92bpm  TEMP: ( )  RESP:  Physical Exam Vitals and nursing note reviewed.  Constitutional:      General: He is not in acute distress.    Appearance: Normal appearance. He is not ill-appearing.  HENT:     Head: Normocephalic and atraumatic.     Right Ear: External ear normal.     Left Ear: External ear normal.     Nose: No congestion.  Eyes:     Extraocular Movements: Extraocular movements intact.  Cardiovascular:     Rate and Rhythm: Normal rate.     Pulses: Normal pulses.  Pulmonary:     Effort: Pulmonary effort is normal. No respiratory distress.  Abdominal:     General: There is no distension.      Palpations: Abdomen is soft.  Musculoskeletal:        General: No tenderness or signs of injury.     Cervical back: Neck supple.     Right lower leg: No edema.     Left lower leg: No edema.     Comments: Patient has good distal strength without clonus.  Skin:    Findings: No erythema or rash.  Neurological:     General: No focal deficit present.     Mental Status: He is alert and oriented to person, place, and time.     Sensory: No sensory deficit.     Motor: No weakness or abnormal muscle tone.     Coordination: Coordination normal.  Psychiatric:        Mood and Affect: Mood normal.        Behavior: Behavior normal.     Imaging: No results found.

## 2020-11-10 ENCOUNTER — Ambulatory Visit: Payer: Medicare Other

## 2020-11-11 ENCOUNTER — Other Ambulatory Visit: Payer: Self-pay

## 2020-11-11 ENCOUNTER — Ambulatory Visit
Admission: RE | Admit: 2020-11-11 | Discharge: 2020-11-11 | Disposition: A | Discharge status: Home / Self Care | Payer: Medicare Other | Source: Ambulatory Visit | Attending: Radiation Oncology | Admitting: Radiation Oncology

## 2020-11-11 DIAGNOSIS — Z510 Encounter for antineoplastic radiation therapy: Secondary | ICD-10-CM | POA: Diagnosis not present

## 2020-11-11 DIAGNOSIS — C61 Malignant neoplasm of prostate: Secondary | ICD-10-CM | POA: Diagnosis not present

## 2020-11-12 ENCOUNTER — Ambulatory Visit
Admission: RE | Admit: 2020-11-12 | Discharge: 2020-11-12 | Disposition: A | Discharge status: Home / Self Care | Payer: Medicare Other | Source: Ambulatory Visit | Attending: Radiation Oncology | Admitting: Radiation Oncology

## 2020-11-12 DIAGNOSIS — Z510 Encounter for antineoplastic radiation therapy: Secondary | ICD-10-CM | POA: Diagnosis not present

## 2020-11-13 ENCOUNTER — Ambulatory Visit
Admission: RE | Admit: 2020-11-13 | Discharge: 2020-11-13 | Disposition: A | Discharge status: Home / Self Care | Payer: Medicare Other | Source: Ambulatory Visit | Attending: Radiation Oncology | Admitting: Radiation Oncology

## 2020-11-13 ENCOUNTER — Other Ambulatory Visit: Payer: Self-pay

## 2020-11-13 DIAGNOSIS — Z510 Encounter for antineoplastic radiation therapy: Secondary | ICD-10-CM | POA: Diagnosis not present

## 2020-11-14 ENCOUNTER — Ambulatory Visit
Admission: RE | Admit: 2020-11-14 | Discharge: 2020-11-14 | Disposition: A | Discharge status: Home / Self Care | Payer: Medicare Other | Source: Ambulatory Visit | Attending: Radiation Oncology | Admitting: Radiation Oncology

## 2020-11-14 DIAGNOSIS — Z510 Encounter for antineoplastic radiation therapy: Secondary | ICD-10-CM | POA: Diagnosis not present

## 2020-11-17 ENCOUNTER — Other Ambulatory Visit: Payer: Self-pay

## 2020-11-17 ENCOUNTER — Ambulatory Visit
Admission: RE | Admit: 2020-11-17 | Discharge: 2020-11-17 | Disposition: A | Discharge status: Home / Self Care | Payer: Medicare Other | Source: Ambulatory Visit | Attending: Radiation Oncology | Admitting: Radiation Oncology

## 2020-11-17 DIAGNOSIS — Z510 Encounter for antineoplastic radiation therapy: Secondary | ICD-10-CM | POA: Diagnosis not present

## 2020-11-18 ENCOUNTER — Ambulatory Visit
Admission: RE | Admit: 2020-11-18 | Discharge: 2020-11-18 | Disposition: A | Discharge status: Home / Self Care | Payer: Medicare Other | Source: Ambulatory Visit | Attending: Radiation Oncology | Admitting: Radiation Oncology

## 2020-11-18 ENCOUNTER — Ambulatory Visit: Payer: Medicare Other

## 2020-11-18 DIAGNOSIS — Z510 Encounter for antineoplastic radiation therapy: Secondary | ICD-10-CM | POA: Diagnosis not present

## 2020-11-19 ENCOUNTER — Other Ambulatory Visit: Payer: Self-pay

## 2020-11-19 ENCOUNTER — Ambulatory Visit
Admission: RE | Admit: 2020-11-19 | Discharge: 2020-11-19 | Disposition: A | Discharge status: Home / Self Care | Payer: Medicare Other | Source: Ambulatory Visit | Attending: Radiation Oncology | Admitting: Radiation Oncology

## 2020-11-19 ENCOUNTER — Encounter: Payer: Self-pay | Admitting: Urology

## 2020-11-19 DIAGNOSIS — C61 Malignant neoplasm of prostate: Secondary | ICD-10-CM

## 2020-11-19 DIAGNOSIS — Z510 Encounter for antineoplastic radiation therapy: Secondary | ICD-10-CM | POA: Diagnosis not present

## 2020-11-24 ENCOUNTER — Ambulatory Visit (INDEPENDENT_AMBULATORY_CARE_PROVIDER_SITE_OTHER): Payer: Medicare Other | Admitting: Physician Assistant

## 2020-11-24 ENCOUNTER — Encounter: Payer: Self-pay | Admitting: Physician Assistant

## 2020-11-24 ENCOUNTER — Other Ambulatory Visit: Payer: Self-pay

## 2020-11-24 DIAGNOSIS — M545 Low back pain, unspecified: Secondary | ICD-10-CM | POA: Diagnosis not present

## 2020-11-24 DIAGNOSIS — G8929 Other chronic pain: Secondary | ICD-10-CM | POA: Diagnosis not present

## 2020-11-24 NOTE — Progress Notes (Signed)
Blake Mcclure returns today for follow-up of his low back pain status post epidural steroid injection with Dr. Ernestina Patches this was a L5-S1 right paramedian epidural injection.  He states that his pain has greatly dissipated.  He states he is having 10 thumb pain whenever he awakens in the morning now is only 2 out of 10 pain.  He denies any radicular symptoms down either leg and is having no significant pain during the day after he stretches.  He is doing home exercise program.  Physical exam: General well-developed well-nourished male no acute distress ambulates without any assistive device. Lumbar spine: Negative straight leg raise bilaterally.  Full range of motion bilateral hips without pain.  Ambulates without any assistive devices nonantalgic gait.   Impression: Chronic low back pain Lumbar radiculopathy  Plan: Recommend he continue his home exercise program.  He can call us if his pain returns.  He has no significant changes in his overall symptoms then we definitely could repeat his injection without repeating his MRI.  This was explained to the patient at length.  Follow-up as needed.

## 2020-12-24 ENCOUNTER — Encounter: Payer: Self-pay | Admitting: Urology

## 2020-12-24 NOTE — Progress Notes (Addendum)
Patient states doing well. No symptoms reported at this time.  Meaningful use complete.  I-PSS Score of 1 (mild).  No current urinary management medications and no urology follow-up scheduled as of yet -per patient.  Patient notified of 9:30am-12/25/20 telephone appointment and verbalized understanding.  Patient contact- [phone]

## 2020-12-24 NOTE — Progress Notes (Signed)
  Radiation Oncology         (336) [phone] ________________________________  Name: Blake Mcclure MRN: [id number]  Date: 11/19/2020  DOB: 06-01-1950  End of Treatment Note  Diagnosis:   70 y.o. gentleman with Stage T1c adenocarcinoma of the prostate with Gleason score of 3+4, and PSA of 4.18.     Indication for treatment:  Curative, Definitive Radiotherapy       Radiation treatment dates:   10/09/20 - 11/19/20  Site/dose:   The prostate was treated to 70 Gy in 28 fractions of 2.5 Gy  Beams/energy:   The patient was treated with IMRT using volumetric arc therapy delivering 6 MV X-rays to clockwise and counterclockwise circumferential arcs with a 90 degree collimator offset to avoid dose scalloping.  Image guidance was performed with daily cone beam CT prior to each fraction to align to gold markers in the prostate and assure proper bladder and rectal fill volumes.  Immobilization was achieved with BodyFix custom mold.  Narrative: The patient tolerated radiation treatment relatively well with only minor urinary irritation and modest fatigue.    Plan: The patient has completed radiation treatment. He will return to radiation oncology clinic for routine followup in one month. I advised him to call or return sooner if he has any questions or concerns related to his recovery or treatment. ________________________________  Sheral Apley. Tammi Klippel, M.D.

## 2020-12-25 ENCOUNTER — Ambulatory Visit
Admission: RE | Admit: 2020-12-25 | Discharge: 2020-12-25 | Disposition: A | Discharge status: Home / Self Care | Payer: Medicare Other | Source: Ambulatory Visit | Attending: Urology | Admitting: Urology

## 2020-12-25 DIAGNOSIS — C61 Malignant neoplasm of prostate: Secondary | ICD-10-CM

## 2020-12-25 NOTE — Progress Notes (Signed)
Radiation Oncology         (336) [phone] ________________________________  Name: Mckay Tegtmeyer Popescu MRN: [id number]  Date: 12/25/2020  DOB: [birth year]  Post Treatment Note  CC: Willey Blade, MD  Lucas Mallow, MD  Diagnosis:   70 y.o. gentleman with Stage T1c adenocarcinoma of the prostate with Gleason score of 3+4, and PSA of 4.18.   Interval Since Last Radiation:  5 weeks  10/09/20 - 11/19/20: The prostate was treated to 70 Gy in 28 fractions of 2.5 Gy  Narrative: I spoke with the patient to conduct his routine scheduled 1 month follow up visit via telephone to spare the patient unnecessary potential exposure in the healthcare setting during the current COVID-19 pandemic.  The patient was notified in advance and gave permission to proceed with this visit format.  He tolerated radiation treatment relatively well with only minor urinary irritation and modest fatigue.                               On review of systems, the patient states that he is doing very well in general.  He really did not experience any ill side effects during treatment so he feels that he has remained at baseline regarding his LUTS and bowel habits.  He specifically denies dysuria, gross hematuria, excessive daytime frequency, urgency, straining to void, incomplete bladder emptying or incontinence.  He reports a healthy appetite and is maintaining his weight.  He denies abdominal pain, nausea, vomiting, diarrhea or constipation.  His energy level has remained unchanged and he has been able to stay active which he is most pleased with.  Overall, he is quite pleased with his progress to date.  ALLERGIES:  has No Known Allergies.  Meds: Current Outpatient Medications  Medication Sig Dispense Refill   amoxicillin (AMOXIL) 875 MG tablet Take 875 mg by mouth 2 (two) times daily.     ascorbic acid (VITAMIN C) 1000 MG tablet Vitamin C 1,000 mg tablet  Take by oral route.     aspirin 81 MG EC tablet Adult Aspirin  Regimen 81 mg tablet,delayed release  Take 1 tablet every day by oral route.     cetirizine (ZYRTEC) 10 MG tablet Aller-Tec 10 mg tablet  Take 1 tablet every day by oral route.     Cholecalciferol 25 MCG (1000 UT) tablet Vitamin D3 25 mcg (1,000 unit) tablet  Take by oral route.     Coenzyme Q10 100 MG capsule coenzyme Q10 100 mg capsule  1 capsule orally once a day     cyclobenzaprine (FLEXERIL) 10 MG tablet Take 1 tablet (10 mg total) by mouth at bedtime. 20 tablet 1   Eszopiclone 3 MG TABS      metFORMIN (GLUCOPHAGE) 500 MG tablet metformin 500 mg tablet     omeprazole (PRILOSEC) 40 MG capsule omeprazole 40 mg capsule,delayed release     rosuvastatin (CRESTOR) 20 MG tablet rosuvastatin 20 mg tablet  TAKE 1 TABLET DAILY     sildenafil (VIAGRA) 50 MG tablet sildenafil 50 mg tablet     tadalafil (CIALIS) 20 MG tablet tadalafil 20 mg tablet  Take 1 tablet by oral route as directed.     valsartan (DIOVAN) 160 MG tablet Take 160 mg by mouth daily.     zolpidem (AMBIEN CR) 12.5 MG CR tablet zolpidem ER 12.5 mg tablet,extended release,multiphase  1 tablet orally at bedtime     No current facility-administered medications  for this encounter.    Physical Findings:  vitals were not taken for this visit.  Pain Assessment Pain Score: 0-No pain/10 Unable to assess due to telephone follow-up visit format.  Lab Findings: Lab Results  Component Value Date   WBC 4.4 12/21/2009   HGB 11.5 (L) 12/21/2009   HCT 35.5 (L) 12/21/2009   MCV 75.9 (L) 12/21/2009   PLT 247 12/21/2009     Radiographic Findings: No results found.  Impression/Plan: 1. 70 y.o. gentleman with Stage T1c adenocarcinoma of the prostate with Gleason score of 3+4, and PSA of 4.18.  He will continue to follow up with urology for ongoing PSA determinations but does not currently have any follow-up appointments scheduled with Dr. Gloriann Loan to his knowledge. He understands what to expect with regards to PSA monitoring going  forward. I will look forward to following his response to treatment via correspondence with urology, and would be happy to continue to participate in his care if clinically indicated. I talked to the patient about what to expect in the future, including his risk for erectile dysfunction and rectal bleeding. I encouraged him to call or return to the office if he has any questions regarding his previous radiation or possible radiation side effects. He was comfortable with this plan and will follow up as needed.     Nicholos Johns, PA-C

## 2021-01-13 ENCOUNTER — Telehealth: Payer: Self-pay | Admitting: *Deleted

## 2021-01-20 ENCOUNTER — Other Ambulatory Visit: Payer: Self-pay

## 2021-01-20 ENCOUNTER — Encounter: Payer: Self-pay | Admitting: *Deleted

## 2021-01-20 ENCOUNTER — Inpatient Hospital Stay: Payer: Medicare Other | Attending: Adult Health | Admitting: *Deleted

## 2021-01-20 VITALS — BP 138/76 | HR 83 | Temp 97.5°F | Resp 18 | Ht 70.0 in | Wt 189.3 lb

## 2021-01-20 DIAGNOSIS — C61 Malignant neoplasm of prostate: Secondary | ICD-10-CM

## 2021-01-21 NOTE — Progress Notes (Signed)
°  SCP reviewed and completed. SDOH assessed and completed. Pt denies pain. He says he is feeling well and back to his norm. Denies fatigue, sleeping well. Pt says he has no urgency or hesitancy. Urine stream is normal.

## 2021-02-07 IMAGING — MR MR SHOULDER*L* W/O CM
4 of 5 series · 21 of 40 positions shown · non-contrast
Comparison: Radiographs 03/14/2019

CLINICAL DATA: Left shoulder pain preop for shoulder arthroplasty.

EXAM:
MRI OF THE LEFT SHOULDER WITHOUT CONTRAST
TECHNIQUE: Multiplanar, multisequence MR imaging of the shoulder was performed.
No intravenous contrast was administered.

[Series 6: PD fat-sat · axial · left · 4.0mm · 0.44mm/px · z∈[-116,-15]mm · 8 of 23 slices shown (1 of 2)]
[im 1/23]
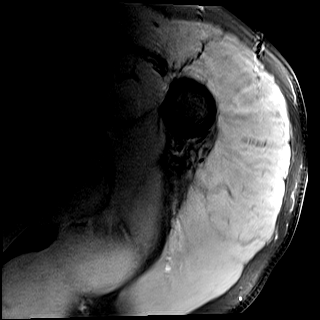
[im 4/23]
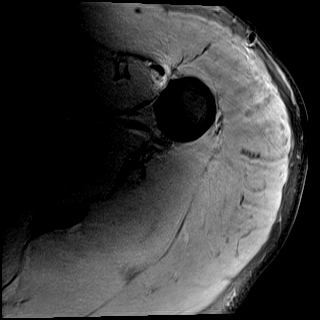
[im 7/23]
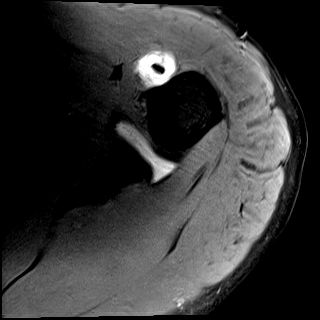
[im 10/23]
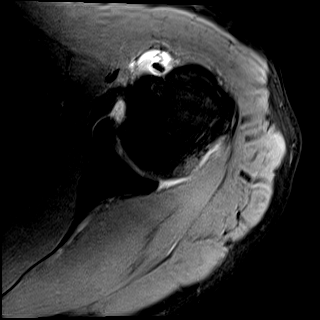
[im 13/23]
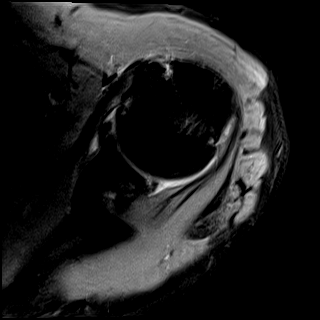
[im 16/23]
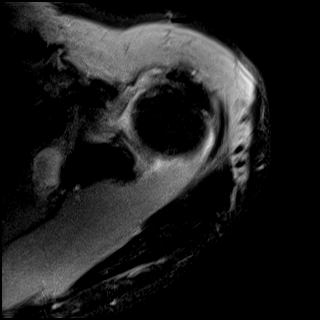
[im 19/23]
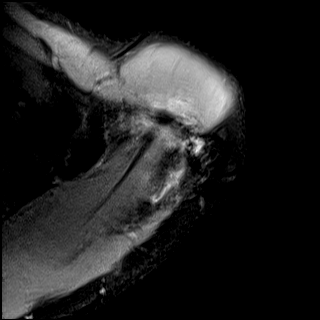
[im 23/23]
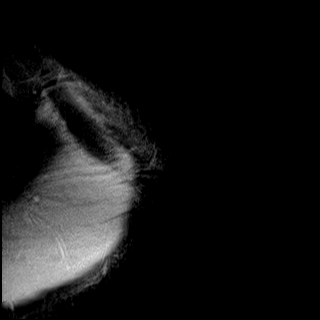

[Series 7: T2 fat-sat · oblique · left · 4.0mm · 0.22mm/px · 3 of 21 slices shown (1 of 2)]
[im 3/21]
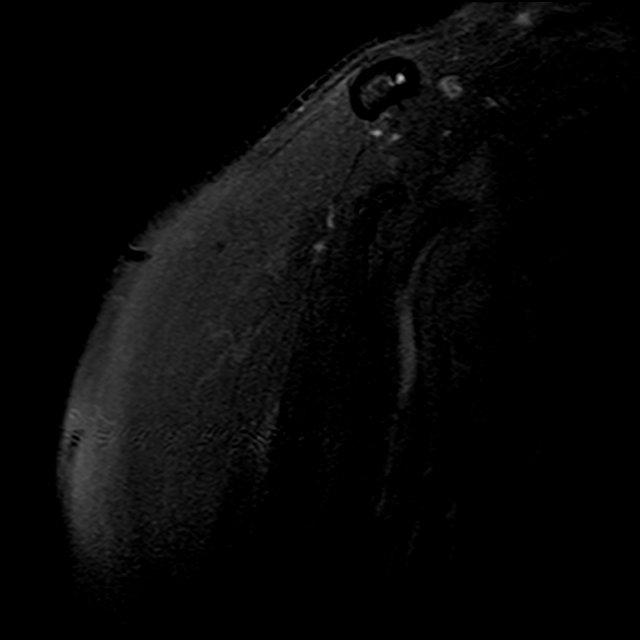
[im 12/21]
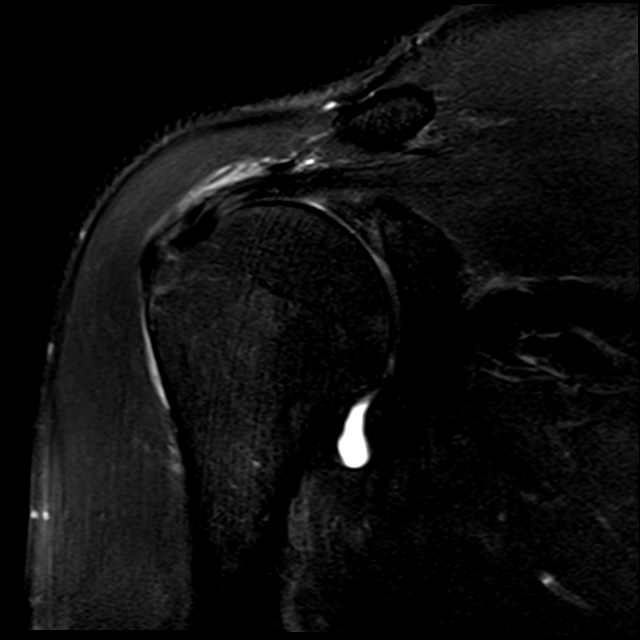
[im 18/21]
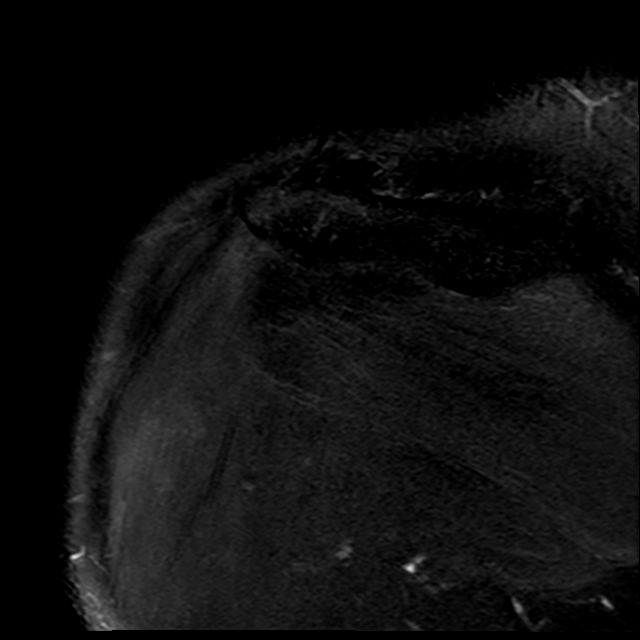

[Series 9: T2 fat-sat · coronal · left · 4.0mm · 0.44mm/px · 3 of 23 slices shown (2 of 2)]
[im 4/23]
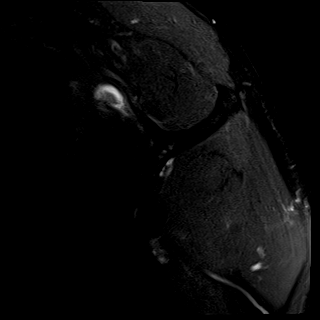
[im 13/23]
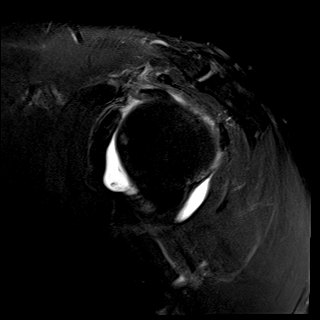
[im 19/23]
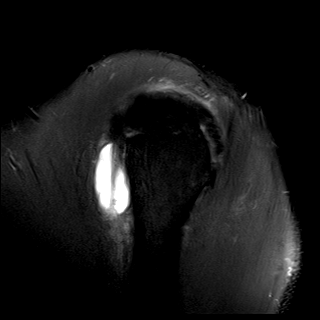

[Series 11: PD fat-sat · oblique · left · 4.0mm · 0.22mm/px · 7 of 21 slices shown (2 of 2)]
[im 1/21]
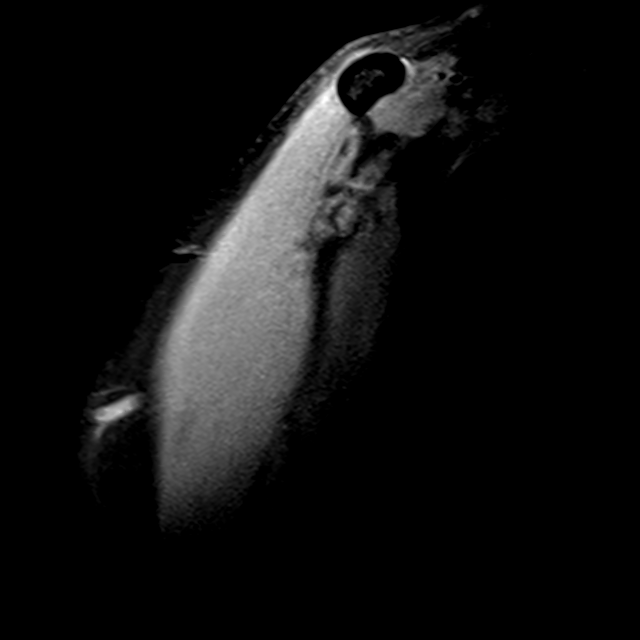
[im 3/21]
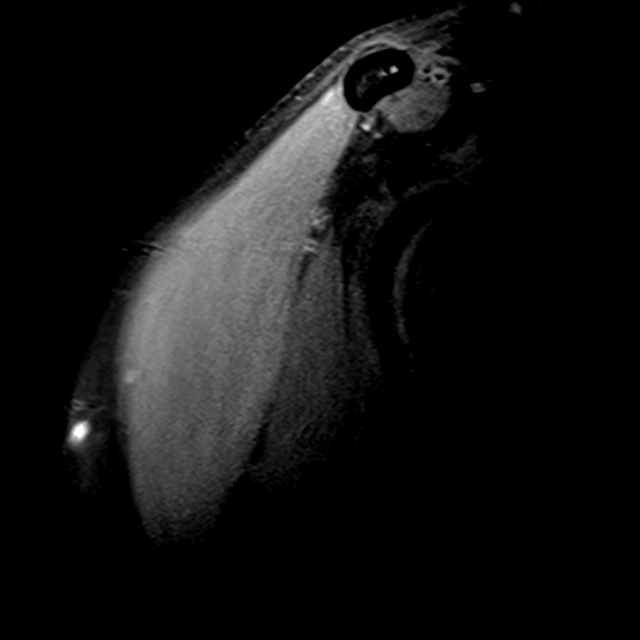
[im 6/21]
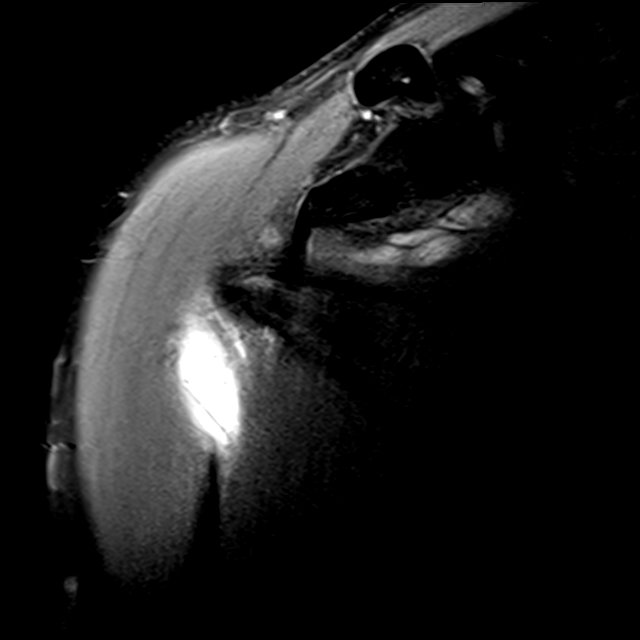
[im 9/21]
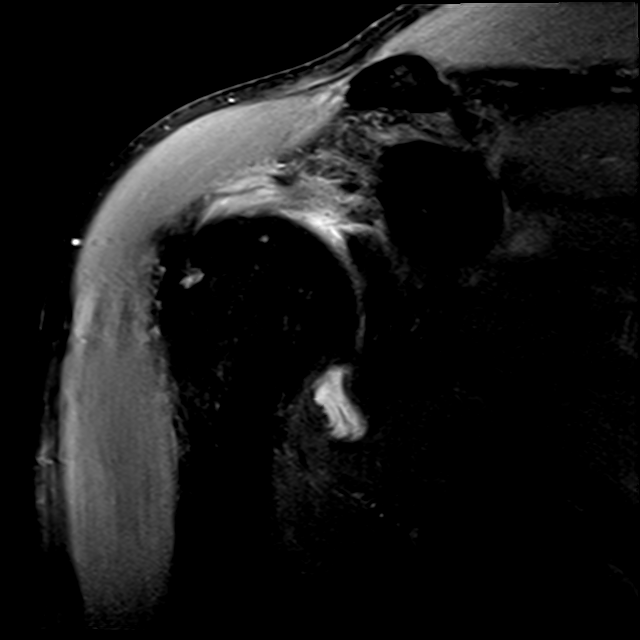
[im 12/21]
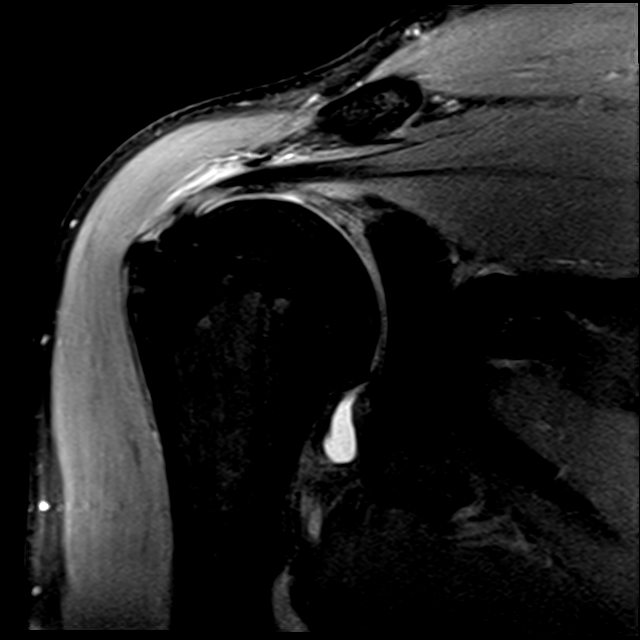
[im 15/21]
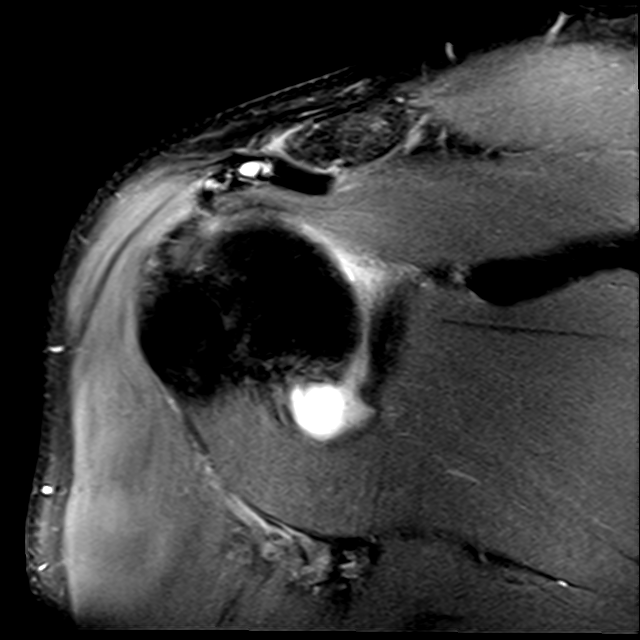
[im 18/21]
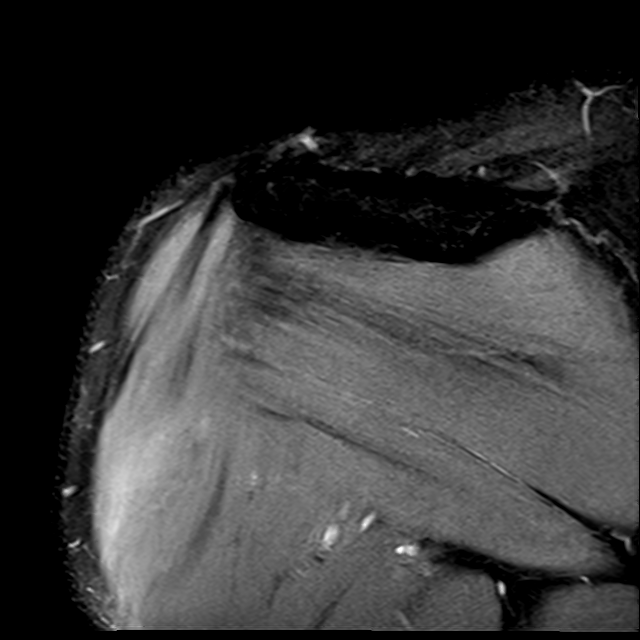

[21 of 40 positions shown; findings below may reference images not displayed]

FINDINGS: Rotator cuff: Moderate rotator cuff tendinopathy/tendinosis with
interstitial tears and also shallow bursal surface tears involving
the supraspinatus tendon. No full-thickness retracted tear is
identified.

Muscles:  No significant findings.

Biceps long head: Intact. There is moderate tendinopathy involving
the intra-articular portion.

Acromioclavicular Joint: Mild to moderate AC joint degenerative
changes. Type 2 acromion. No significant lateral downsloping or
undersurface spurring.

Glenohumeral Joint: Moderate degenerative changes with cartilage
thinning, joint space narrowing and early spurring change. Small
joint effusion and mild synovitis.

Labrum:  Labral degenerative changes without discrete tear.

Bones:  No acute bony findings.

Other: Mild subacromial/subdeltoid fluid suggesting bursitis. There
is also moderate fluid in the subcoracoid bursa.
IMPRESSION: 1. Moderate rotator cuff tendinopathy/tendinosis with interstitial
tears and shallow bursal surface tears involving the supraspinatus
tendon. No full-thickness retracted tear.
2. Moderate tendinopathy involving the intra-articular portion of
the long head biceps tendon.
3. Moderate glenohumeral joint degenerative changes.
4. Mild to moderate AC joint degenerative changes but no other
significant findings for bony impingement.
5. Mild subacromial/subdeltoid and subcoracoid bursitis.

## 2021-07-27 ENCOUNTER — Encounter: Payer: Self-pay | Admitting: Physician Assistant

## 2021-07-27 ENCOUNTER — Ambulatory Visit (INDEPENDENT_AMBULATORY_CARE_PROVIDER_SITE_OTHER): Payer: Medicare Other | Admitting: Physician Assistant

## 2021-07-27 DIAGNOSIS — M4606 Spinal enthesopathy, lumbar region: Secondary | ICD-10-CM

## 2021-07-27 MED ORDER — LIDOCAINE HCL 1 % IJ SOLN
3.0000 mL | INTRAMUSCULAR | Status: AC | PRN
Start: 2021-07-27 — End: 2021-07-27
  Administered 2021-07-27: 3 mL

## 2021-07-27 MED ORDER — METHYLPREDNISOLONE ACETATE 40 MG/ML IJ SUSP
40.0000 mg | INTRAMUSCULAR | Status: AC | PRN
  Administered 2021-07-27: 40 mg via INTRAMUSCULAR

## 2021-07-27 NOTE — Progress Notes (Signed)
   Procedure Note  Patient: Blake Mcclure             Date of Birth: [birth year]           MRN: [id number]             Visit Date: 07/27/2021 HPI: [name] comes in today with "hip pain".  Pains been there for months now injury no shooting pain.  Nicely in the a.m. lateral aspect points to the right buttocks region as the source of his pain.  He denies any groin pain.  He takes over-the-counter NSAIDs.  He is diabetic last hemoglobin A1c was 6.2.  ROS:See HPI Physical exam: General well-developed well-nourished male ambulates without any assistive device. Hips excellent range of motion without pain.  Nontender trochanteric region both hips.  Tenderness over the lower lumbar sacral paraspinous region on the right.  Procedures: Visit Diagnoses:  1. Spinal enthesopathy, lumbar region [facility])     Trigger Point Inj  Date/Time: 07/27/2021 3:11 PM  Performed by: Pete Pelt, PA-C Authorized by: Pete Pelt, PA-C   Total # of Trigger Points:  1 Location: back   Needle Size:  25 G Approach:  Dorsal Medications #1:  3 mL lidocaine 1 %; 40 mg methylPREDNISolone acetate 40 MG/ML Patient tolerance:  Patient tolerated the procedure well with no immediate complications  Plan:   He is to continue back exercises as taught by therapy.  Follow-up with pain persist or becomes worse.

## 2022-04-15 ENCOUNTER — Encounter: Payer: Self-pay | Admitting: Radiology

## 2022-10-26 ENCOUNTER — Ambulatory Visit
Admission: RE | Admit: 2022-10-26 | Discharge: 2022-10-26 | Disposition: A | Discharge status: Home / Self Care | Payer: TRICARE For Life (TFL) | Source: Ambulatory Visit | Attending: Internal Medicine | Admitting: Internal Medicine

## 2022-10-26 ENCOUNTER — Other Ambulatory Visit: Payer: Self-pay | Admitting: Internal Medicine

## 2022-10-26 DIAGNOSIS — M545 Low back pain, unspecified: Secondary | ICD-10-CM

## 2023-12-31 ENCOUNTER — Ambulatory Visit (HOSPITAL_COMMUNITY): Admission: EM | Admit: 2023-12-31 | Discharge: 2023-12-31 | Disposition: A | Discharge status: Home / Self Care

## 2023-12-31 ENCOUNTER — Ambulatory Visit (INDEPENDENT_AMBULATORY_CARE_PROVIDER_SITE_OTHER)

## 2023-12-31 ENCOUNTER — Encounter (HOSPITAL_COMMUNITY): Payer: Self-pay | Admitting: Emergency Medicine

## 2023-12-31 DIAGNOSIS — M7731 Calcaneal spur, right foot: Secondary | ICD-10-CM

## 2023-12-31 DIAGNOSIS — M79671 Pain in right foot: Secondary | ICD-10-CM

## 2023-12-31 HISTORY — DX: Essential (primary) hypertension: I10

## 2023-12-31 MED ORDER — DICLOFENAC SODIUM 50 MG PO TBEC: 50.0000 mg | DELAYED_RELEASE_TABLET | Freq: Two times a day (BID) | ORAL | 1 refills | Status: DC

## 2023-12-31 NOTE — Discharge Instructions (Addendum)
  1. Heel spur, right (Primary) - DG Foot Complete Right x-ray performed in UC shows no acute fracture or dislocation, possible bone spur noted to calcaneus which may be contributing to pain. - diclofenac  (VOLTAREN ) 50 MG EC tablet; Take 1 tablet (50 mg total) by mouth 2 (two) times daily.  Dispense: 30 tablet; Refill: 1 - AMB referral to orthopedics for follow-up evaluation and ongoing management of of right heel spur with heel pain.  -Continue to monitor symptoms for any change in severity if there is any escalation of current symptoms or development of new symptoms follow-up in ER for further evaluation and management.

## 2023-12-31 NOTE — ED Triage Notes (Signed)
 Pt reports has spur on right heel that has been hurting for about 10 days. Denies any injury. Tried getting a cushion for foot but didn't help

## 2023-12-31 NOTE — ED Provider Notes (Signed)
 [facility]  Note:  This document was prepared using Conservation officer, historic buildings and may include unintentional dictation errors.  MRN: [id number] DOB: 12-30-1950  Subjective:   Blake Mcclure is a 73 y.o. male presenting for right heel pain x 10 to 14 days.  Patient reports no known injury or trauma to the foot.  Patient reports increased pain with ambulation and standing for long periods of time.  Has been using cushioned shoes with heel support with minimal improvement to symptoms.  Patient has not taken any over-the-counter medication to treat symptoms.  Denies ever seeing orthopedics for evaluation of heel spurs.  No current facility-administered medications for this encounter.  Current Outpatient Medications:    diclofenac  (VOLTAREN ) 50 MG EC tablet, Take 1 tablet (50 mg total) by mouth 2 (two) times daily., Disp: 30 tablet, Rfl: 1   ascorbic acid (VITAMIN C) 1000 MG tablet, Vitamin C 1,000 mg tablet  Take by oral route., Disp: , Rfl:    aspirin 81 MG EC tablet, Adult Aspirin Regimen 81 mg tablet,delayed release  Take 1 tablet every day by oral route., Disp: , Rfl:    cetirizine (ZYRTEC) 10 MG tablet, Aller-Tec 10 mg tablet  Take 1 tablet every day by oral route., Disp: , Rfl:    Cholecalciferol 25 MCG (1000 UT) tablet, Vitamin D3 25 mcg (1,000 unit) tablet  Take by oral route., Disp: , Rfl:    Coenzyme Q10 100 MG capsule, coenzyme Q10 100 mg capsule  1 capsule orally once a day, Disp: , Rfl:    metFORMIN (GLUCOPHAGE) 500 MG tablet, metformin 500 mg tablet, Disp: , Rfl:    omeprazole (PRILOSEC) 40 MG capsule, omeprazole 40 mg capsule,delayed release, Disp: , Rfl:    oxybutynin (DITROPAN-XL) 5 MG 24 hr tablet, TAKE 1 TABLET BY MOUTH EVERY DAY IN THE MORNING, Disp: , Rfl:    rosuvastatin (CRESTOR) 20 MG tablet, rosuvastatin 20 mg tablet  TAKE 1 TABLET DAILY, Disp: , Rfl:    sildenafil (VIAGRA) 50 MG tablet, sildenafil 50 mg tablet, Disp: , Rfl:    tadalafil  (CIALIS) 20 MG tablet, tadalafil 20 mg tablet  Take 1 tablet by oral route as directed., Disp: , Rfl:    tamsulosin (FLOMAX) 0.4 MG CAPS capsule, Take 0.4 mg by mouth 2 (two) times daily., Disp: , Rfl:    valsartan (DIOVAN) 160 MG tablet, Take 160 mg by mouth daily., Disp: , Rfl:    zolpidem (AMBIEN CR) 12.5 MG CR tablet, zolpidem ER 12.5 mg tablet,extended release,multiphase  1 tablet orally at bedtime, Disp: , Rfl:    No Known Allergies  Past Medical History:  Diagnosis Date   Hypertension      History reviewed. No pertinent surgical history.  Family History  Problem Relation Age of Onset   Healthy Mother    Healthy Father     Social History   Tobacco Use   Smoking status: Never   Smokeless tobacco: Never  Vaping Use   Vaping status: Never Used  Substance Use Topics   Alcohol use: Not Currently    ROS Refer to HPI for ROS details.  Objective:    Vitals: BP (!) 146/76 (BP Location: Right Arm)   Pulse 60   Temp (!) 97.4 F (36.3 C) (Oral)   Resp 15   SpO2 97%   Physical Exam Vitals and nursing note reviewed.  Constitutional:      General: He is not in acute distress.    Appearance: Normal appearance.  He is not ill-appearing or toxic-appearing.  HENT:     Head: Normocephalic.  Cardiovascular:     Rate and Rhythm: Normal rate.  Pulmonary:     Effort: Pulmonary effort is normal. No respiratory distress.  Musculoskeletal:     Left foot: Normal range of motion. Tenderness and bony tenderness present. No swelling or crepitus. Normal pulse.  Skin:    General: Skin is warm and dry.     Capillary Refill: Capillary refill takes less than 2 seconds.  Neurological:     General: No focal deficit present.     Mental Status: He is alert and oriented to person, place, and time.  Psychiatric:        Mood and Affect: Mood normal.        Behavior: Behavior normal.     Procedures  No results found for this or any previous visit (from the past 24  hours).  Assessment and Plan :     Discharge Instructions       1. Heel spur, right (Primary) - DG Foot Complete Right x-ray performed in UC shows no acute fracture or dislocation, possible bone spur noted to calcaneus which may be contributing to pain. - diclofenac  (VOLTAREN ) 50 MG EC tablet; Take 1 tablet (50 mg total) by mouth 2 (two) times daily.  Dispense: 30 tablet; Refill: 1 - AMB referral to orthopedics for follow-up evaluation and ongoing management of of right heel spur with heel pain.  -Continue to monitor symptoms for any change in severity if there is any escalation of current symptoms or development of new symptoms follow-up in ER for further evaluation and management.      Blake Mcclure   Blake Mcclure, [place], [place] 12/31/23 1357

## 2024-02-23 ENCOUNTER — Other Ambulatory Visit (INDEPENDENT_AMBULATORY_CARE_PROVIDER_SITE_OTHER): Payer: Self-pay

## 2024-02-23 ENCOUNTER — Ambulatory Visit: Admitting: Physician Assistant

## 2024-02-23 DIAGNOSIS — M79671 Pain in right foot: Secondary | ICD-10-CM

## 2024-02-23 NOTE — Progress Notes (Signed)
 HPI: [name] due to right foot pain.  He states he has had heel pain has been getting worse over the last 2 months.  Ranks pain to be 8 out of 10 at worst.  It is worse first thing in the morning whenever he is to ambulate.  Has had no injury.  He does wear open back shoes around his home.  He is taking Tylenol  for the heel pain.  Otherwise no treatment.  Review of systems: See HPI otherwise negative  Physical exam: General well-developed well-nourished male in no acute distress mood and affect appropriate.    Ambulates without the assistive device. Psych alert and oriented x 3. Right foot: No rashes skin lesions ulcerations.  Dorsal pedal pulses 2+ posterior tibial pulses 2+.  Subjectively sensation intact throughout the foot.  Has no tenderness over the posterior tibial tendons peroneal tendons.  Nontender over the Achilles.  Achilles is intact.  Tight gastroc bilaterally.  Maximal tenderness right foot is over the medial tubercle of the calcaneus.  Radiographs: Lateral and Harris view of the right heel shows no acute fractures.  There is a plantar spur.  Otherwise no acute findings or bony abnormalities.  Impression: Right foot plantar fasciitis  Plan: Recommend he be good about his shoewear and refrain from wearing open back shoes.  Discussed stretching techniques that he can do on his own and any these are demonstrated to him.  Use of Voltaren  gel 4 g 4 times daily to the heel.  He will follow-up with us  in 4 weeks if he continues to have pain would recommend either shockwave therapy or formal therapy to the heel.  Questions were encouraged and answered at length.

## 2024-02-27 ENCOUNTER — Ambulatory Visit (HOSPITAL_COMMUNITY)
Admission: EM | Admit: 2024-02-27 | Discharge: 2024-02-27 | Disposition: A | Discharge status: Home / Self Care | Attending: Nurse Practitioner | Admitting: Nurse Practitioner

## 2024-02-27 ENCOUNTER — Encounter (HOSPITAL_COMMUNITY): Payer: Self-pay | Admitting: Emergency Medicine

## 2024-02-27 ENCOUNTER — Other Ambulatory Visit: Payer: Self-pay

## 2024-02-27 DIAGNOSIS — R2241 Localized swelling, mass and lump, right lower limb: Secondary | ICD-10-CM

## 2024-02-27 DIAGNOSIS — M79661 Pain in right lower leg: Secondary | ICD-10-CM | POA: Diagnosis not present

## 2024-02-27 DIAGNOSIS — M7989 Other specified soft tissue disorders: Secondary | ICD-10-CM

## 2024-02-27 NOTE — ED Provider Notes (Signed)
 " [facility]    CSN: [id number] Arrival date & time: 02/27/24  1503      History   Chief Complaint Chief Complaint  Patient presents with   Leg Pain    HPI Blake Mcclure is a 74 y.o. male.   Discussed the use of AI scribe software for clinical note transcription with the patient, who gave verbal consent to proceed.   Jusiah presents with acute onset swelling of the right lower extremity that began yesterday (Sunday). The swelling developed suddenly without any known precipitating injury. He reports that he was wearing shorts and sitting while watching a football game when he noticed the swelling upon looking down. He also endorses associated calf pain. He denies redness, increased warmth, or skin discoloration. He has never experienced similar swelling in the past.  The patient denies fever, chills, body aches, chest pain, shortness of breath, palpitations, numbness, tingling, or weakness in the affected leg. He reports being seen by his primary care provider three days ago for a routine visit, at which time his legs were normal. His medical history is significant for hyperlipidemia, gastroesophageal reflux disease, diabetes, and hypertension. He denies tobacco use, recent travel, use of blood thinners, history of blood clots, hormone therapy, or chemotherapy.  The following sections of the patient's history were reviewed and updated as appropriate: allergies, current medications, past family history, past medical history, past social history, past surgical history, and problem list.     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 No pertinent surgical history.     Home Medications    Prior to Admission medications  Medication Sig Start Date End Date Taking? Authorizing Provider  ascorbic acid (VITAMIN C) 1000 MG tablet Vitamin C 1,000 mg tablet  Take by oral route.    [provider]  aspirin 81 MG EC tablet Adult Aspirin Regimen 81 mg tablet,delayed release  Take 1 tablet every day by oral route.    [provider]  cetirizine (ZYRTEC) 10 MG tablet Aller-Tec 10 mg tablet  Take 1 tablet every day by oral route.    [provider]  Cholecalciferol 25 MCG (1000 UT) tablet Vitamin D3 25 mcg (1,000 unit) tablet  Take by oral route.    [provider]  Coenzyme Q10 100 MG capsule coenzyme Q10 100 mg capsule  1 capsule orally once a day    [provider]  diclofenac  (VOLTAREN ) 50 MG EC tablet Take 1 tablet (50 mg total) by mouth 2 (two) times daily. 12/31/23   Reddick, Johnathan B, NP  metFORMIN (GLUCOPHAGE) 500 MG tablet metformin 500 mg tablet    [provider]  omeprazole (PRILOSEC) 40 MG capsule omeprazole 40 mg capsule,delayed release    [provider]  oxybutynin (DITROPAN-XL) 5 MG 24 hr tablet TAKE 1 TABLET BY MOUTH EVERY DAY IN THE MORNING    [provider]  rosuvastatin (CRESTOR) 20 MG tablet rosuvastatin 20 mg tablet  TAKE 1 TABLET DAILY    [provider]  sildenafil (VIAGRA) 50 MG tablet sildenafil 50 mg tablet    [provider]  tadalafil (CIALIS) 20 MG tablet tadalafil 20 mg tablet  Take 1 tablet by oral route as directed.    [provider]  tamsulosin (FLOMAX) 0.4 MG CAPS capsule Take 0.4 mg by mouth 2 (two) times daily.    [provider]  valsartan (DIOVAN) 160 MG tablet Take 160 mg by mouth daily. 10/22/19    [provider]  zolpidem (AMBIEN CR) 12.5 MG CR tablet zolpidem ER 12.5 mg tablet,extended release,multiphase  1 tablet orally at bedtime    [provider]    Family History Family History  Problem Relation Age of Onset   Healthy Mother    Healthy Father     Social History Social History[1]   Allergies   Patient has no known allergies.   Review of Systems Review of Systems  Constitutional:  Negative for fever.  Respiratory:  Negative for chest tightness and shortness of breath.   Cardiovascular:  Positive for leg swelling. Negative for chest pain and palpitations.  Skin:  Negative for color change.  Neurological:  Negative for weakness and numbness.  All other systems reviewed and are negative.    Physical Exam Triage Vital Signs ED Triage Vitals  Encounter Vitals Group     BP 02/27/24 1705 (!) 151/70     Girls Systolic BP Percentile --      Girls Diastolic BP Percentile --      Boys Systolic BP Percentile --      Boys Diastolic BP Percentile --      Pulse Rate 02/27/24 1705 74     Resp 02/27/24 1705 18     Temp 02/27/24 1705 97.7 F (36.5 C)     Temp Source 02/27/24 1705 Oral     SpO2 02/27/24 1705 97 %     Weight --      Height --      Head Circumference --      Peak Flow --      Pain Score 02/27/24 1703 6     Pain Loc --      Pain Education --      Exclude from Growth Chart --    No data found.  Updated Vital Signs BP (!) 151/70 (BP Location: Right Arm)   Pulse 74   Temp 97.7 F (36.5 C) (Oral)   Resp 18   SpO2 97%   Visual Acuity Right Eye Distance:   Left Eye Distance:   Bilateral Distance:    Right Eye Near:   Left Eye Near:    Bilateral Near:     Physical Exam Vitals reviewed.  Constitutional:      General: He is awake. He is not in acute distress.    Appearance: Normal appearance. He is well-developed. He is not ill-appearing, toxic-appearing or diaphoretic.  HENT:     Head: Normocephalic.     Right Ear:  Hearing normal.     Left Ear: Hearing normal.     Nose: Nose normal.     Mouth/Throat:     Mouth: Mucous membranes are moist.  Eyes:     General: Vision grossly intact.     Conjunctiva/sclera: Conjunctivae normal.  Cardiovascular:     Rate and Rhythm: Normal rate and regular rhythm.     Pulses: Normal pulses.     Heart sounds: Normal heart sounds.     Comments: Normal skin color and temperature bilaterally  in the lower extremities. No wounds, open areas, or skin integrity changes noted. Swelling and tenderness in the right calf. Right calf circumference measures 43.0 cm compared to 40.5 cm on the left.    Pulmonary:     Effort: Pulmonary effort is normal.     Breath sounds: Normal breath sounds and air entry.  Musculoskeletal:        General: Normal range of motion.     Cervical back: Normal range of motion and neck supple.     Right lower leg: Edema present.     Left lower leg: No edema.  Skin:    General: Skin is warm and dry.  Neurological:     General: No focal deficit present.     Mental Status: He is alert and oriented to person, place, and time.  Psychiatric:        Speech: Speech normal.        Behavior: Behavior is cooperative.      UC Treatments / Results  Labs (all labs ordered are listed, but only abnormal results are displayed) Labs Reviewed - No data to display  EKG   Radiology No results found.  Procedures ED EKG  Date/Time: 02/27/2024 7:18 PM  Performed by: Iola Lukes, FNP Authorized by: Iola Lukes, FNP   ECG interpreted by ED Physician in the absence of a cardiologist: yes   Rate:    ECG rate:  59   ECG rate assessment: bradycardic   Rhythm:    Rhythm: sinus bradycardia and A-V block     A-V block: 1st Degree   Ectopy:    Ectopy: none   QRS:    QRS axis:  Normal   QRS intervals:  Normal   QRS conduction: normal   ST segments:    ST segments:  Depression and non-specific   Details:  No dynamic changes or reciprocal ST  elevations noted T waves:    T waves: flattening   Q waves:    Abnormal Q-waves: not present    (including critical care time)  Medications Ordered in UC Medications - No data to display  Initial Impression / Assessment and Plan / UC Course  I have reviewed the triage vital signs and the nursing notes.  Pertinent labs & imaging results that were available during my care of the patient were reviewed by me and considered in my medical decision making (see chart for details).     Patient presents with acute onset unilateral swelling of the right lower extremity that began yesterday without any known injury. The swelling was first noticed while at rest and is associated with calf pain. There is no redness, increased warmth, or skin discoloration, and the patient has no prior history of similar symptoms. He denies systemic symptoms including fever, chills, chest pain, shortness of breath, palpitations, or neurologic complaints. He was evaluated by his primary care provider three days prior with a normal lower extremity exam.  Given the sudden onset of unilateral leg swelling and calf pain, the primary concern is possible deep vein thrombosis, despite the absence of classic risk factors such as recent travel, smoking, hormone therapy, malignancy, or prior thromboembolic disease. His chronic conditions, including hypertension, diabetes, and hyperlipidemia, do not independently place him at high risk for DVT but warrant consideration in overall assessment. On evaluation today, the patient is afebrile, non-toxic appearing, hemodynamically stable, with normal heart rate and oxygen saturation of 97% on room air. EKG shows no acute ischemic changes.  A lower extremity  venous ultrasound was ordered to evaluate for DVT and scheduled as an outpatient study for tomorrow morning at 8:00 AM at the [facility]. The patient is stable for outpatient management at this time. He was  advised to follow up with his primary care provider for continued evaluation and to seek emergency care for worsening leg pain or swelling, development of redness or warmth, chest pain, shortness of breath, dizziness, or any other concerning symptoms suggestive of thromboembolic complications.  Today's evaluation has revealed no signs of a dangerous process. Discussed diagnosis with patient and/or guardian. Patient and/or guardian aware of their diagnosis, possible red flag symptoms to watch out for and need for close follow up. Patient and/or guardian understands verbal and written discharge instructions. Patient and/or guardian comfortable with plan and disposition.  Patient and/or guardian has a clear mental status at this time, good insight into illness (after discussion and teaching) and has clear judgment to make decisions regarding their care  Documentation was completed with the aid of voice recognition software. Transcription may contain typographical errors.  Final Clinical Impressions(s) / UC Diagnoses   Final diagnoses:  Pain and swelling of right lower leg     Discharge Instructions      You were seen today for sudden swelling and pain in your right lower leg. Because this started quickly and is affecting only one leg, we need to rule out a possible blood clot. You are stable at this time, and there were no signs today of a heart or lung emergency.  You are scheduled for a leg ultrasound tomorrow morning at 8:00 AM at the [facility]. It is very important that you keep this appointment. Until then, try to rest the leg, keep it elevated when sitting or lying down, and avoid strenuous activity. You may use acetaminophen  for discomfort if needed unless otherwise directed. Avoid massaging the leg.  If the ultrasound shows a blood clot, treatment will be started promptly. If the ultrasound is normal but swelling or pain continues, your primary care  provider may recommend additional evaluation or referral to a specialist.  Go to the emergency department immediately if you develop sudden shortness of breath, chest pain, coughing up blood, dizziness or fainting, rapidly worsening leg pain or swelling, new redness or warmth of the leg, or any other new or concerning symptoms.      ED Prescriptions   None    PDMP not reviewed this encounter.     [1]  Social History Tobacco Use   Smoking status: Never   Smokeless tobacco: Never  Vaping Use   Vaping status: Never Used  Substance Use Topics   Alcohol use: Not Currently   Drug use: Never     Iola Lukes, FNP 02/27/24 1923  "

## 2024-02-27 NOTE — Discharge Instructions (Addendum)
 You were seen today for sudden swelling and pain in your right lower leg. Because this started quickly and is affecting only one leg, we need to rule out a possible blood clot. You are stable at this time, and there were no signs today of a heart or lung emergency.  You are scheduled for a leg ultrasound tomorrow morning at 8:00 AM at the [facility]. It is very important that you keep this appointment. Until then, try to rest the leg, keep it elevated when sitting or lying down, and avoid strenuous activity. You may use acetaminophen  for discomfort if needed unless otherwise directed. Avoid massaging the leg.  If the ultrasound shows a blood clot, treatment will be started promptly. If the ultrasound is normal but swelling or pain continues, your primary care provider may recommend additional evaluation or referral to a specialist.  Go to the emergency department immediately if you develop sudden shortness of breath, chest pain, coughing up blood, dizziness or fainting, rapidly worsening leg pain or swelling, new redness or warmth of the leg, or any other new or concerning symptoms.

## 2024-02-27 NOTE — ED Triage Notes (Signed)
 Right calf pain and swelling for 2 days.  Denies any injury.  Denies any history of this type of complaint.

## 2024-02-28 ENCOUNTER — Encounter: Payer: Self-pay | Admitting: Pharmacist

## 2024-02-28 ENCOUNTER — Ambulatory Visit (HOSPITAL_COMMUNITY)
Admission: RE | Admit: 2024-02-28 | Discharge: 2024-02-28 | Disposition: A | Discharge status: Home / Self Care | Source: Ambulatory Visit | Attending: Nurse Practitioner | Admitting: Nurse Practitioner

## 2024-02-28 ENCOUNTER — Ambulatory Visit (HOSPITAL_COMMUNITY): Payer: Self-pay

## 2024-02-28 ENCOUNTER — Other Ambulatory Visit (HOSPITAL_COMMUNITY): Payer: Self-pay

## 2024-02-28 ENCOUNTER — Telehealth: Payer: Self-pay

## 2024-02-28 ENCOUNTER — Ambulatory Visit: Attending: Surgery | Admitting: Pharmacist

## 2024-02-28 VITALS — BP 165/81 | HR 67

## 2024-02-28 DIAGNOSIS — I82811 Embolism and thrombosis of superficial veins of right lower extremities: Secondary | ICD-10-CM | POA: Insufficient documentation

## 2024-02-28 DIAGNOSIS — R609 Edema, unspecified: Secondary | ICD-10-CM | POA: Insufficient documentation

## 2024-02-28 DIAGNOSIS — M79604 Pain in right leg: Secondary | ICD-10-CM | POA: Diagnosis not present

## 2024-02-28 DIAGNOSIS — I82890 Acute embolism and thrombosis of other specified veins: Secondary | ICD-10-CM

## 2024-02-28 DIAGNOSIS — M79661 Pain in right lower leg: Secondary | ICD-10-CM | POA: Diagnosis present

## 2024-02-28 MED ORDER — APIXABAN 5 MG PO TABS
5.0000 mg | ORAL_TABLET | Freq: Two times a day (BID) | ORAL | 0 refills | Status: AC
  Filled 2024-02-28 (×2): qty 30, 15d supply, fill #0

## 2024-02-28 MED ORDER — ELIQUIS DVT/PE STARTER PACK 5 MG PO TBPK
ORAL_TABLET | ORAL | 0 refills | Status: AC
  Filled 2024-02-28: qty 74, 30d supply, fill #0

## 2024-02-28 NOTE — Patient Instructions (Addendum)
-  Start apixaban  (Eliquis ) 10 mg twice daily for 7 days followed by 5 mg twice daily. - Your refill is on file at [facility] at [facility]. -It is important to take your medication around the same time every day.  -Avoid NSAIDs like ibuprofen (Advil, Motrin) and naproxen  (Aleve ) as well as aspirin doses over 100 mg daily. -Tylenol  (acetaminophen ) is the preferred over the counter pain medication to lower the risk of bleeding. -Be sure to alert all of your health care providers that you are taking an anticoagulant prior to starting a new medication or having a procedure. -Monitor for signs and symptoms of bleeding (abnormal bruising, prolonged bleeding, nose bleeds, bleeding from gums, discolored urine, black tarry stools). If you have fallen and hit your head OR if your bleeding is severe or not stopping, seek emergency care.  -Go to the emergency room if emergent signs and symptoms of new clot occur (new or worse swelling and pain in an arm or leg, shortness of breath, chest pain, fast or irregular heartbeats, lightheadedness, dizziness, fainting, coughing up blood) or if you experience a significant color change (pale or blue) in the extremity that has the blood clot. -We recommend you wear compression stockings (20-30 mmHg) as long as you are having swelling or pain. Be sure to purchase the correct size and take them off at night.   If you have any questions or need to reschedule an appointment, please call [phone]. If you are having an emergency, call 911 or present to the nearest emergency room.

## 2024-02-28 NOTE — Telephone Encounter (Signed)
 Patient Advocate Encounter  Test billing for this patient's current coverage ([employer]) returns a $48 copay for up to 30 day supply of Eliquis .  This test claim was processed through [facility]- copay amounts may vary at other pharmacies due to pharmacy/plan contracts, or as the patient moves through the different stages of their insurance plan.  Rachel DEL, CPhT Rx Patient Advocate Phone: [phone]

## 2024-02-28 NOTE — Progress Notes (Signed)
 " DVT Clinic Note  Name: Blake Mcclure     MRN: [id number]     DOB: [birth year]     Sex: male  PCP: Theo Iha, MD  Today's Visit: Visit Information: Initial Visit  Referred to DVT Clinic by: Urgent Care - Lucie Sarin, FNP Referred to CPP by: Dr. Sheree Reason for referral:  Chief Complaint  Patient presents with   Superficial thrombophlebitis   HISTORY OF PRESENT ILLNESS: Blake Mcclure is a 74 y.o. male who presents after diagnosis superficial thrombophlebitis for medication management. He was seen at [facility] urgent care yesterday for acute onset RLE pain and swelling. 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 He was referred to DVT Clinic to start treatment. He presents to clinic ambulating independently. Reports on Sunday he felt a lump and started to notice swelling and pain so he went to urgent care who ordered the ultrasound. Denies history of VTE. He is normally very active on his feet all day. He works as a designer, industrial/product at [employer].  Positive Thrombotic Risk Factors: Older Age Bleeding Risk Factors: Age >65 years  Negative Thrombotic Risk Factors: Previous VTE, Recent surgery (within 3 months), Recent trauma (within 3 months), Recent admission to hospital with acute illness (within 3 months), Paralysis, paresis, or recent plaster cast immobilization of lower extremity, Central venous catheterization, Sedentary journey lasting >8 hours within 4 weeks, Bed rest >72 hours within 3 months, Within 6 weeks postpartum, Recent cesarean section (within 3 months), Estrogen therapy, Recent COVID diagnosis (within 3 months), Erythropoiesis-stimulating agent, Testosterone therapy, Active cancer, Non-malignant, chronic inflammatory condition, Known thrombophilic condition, Smoking, Obesity  Rx Insurance Coverage:  Medicare Tricare Rx Affordability: Eliquis  is $48 for a 30  day supply Rx Assistance Provided: Free 30-day trial card Preferred Pharmacy: Eliquis  starter pack filled at [facility] using 1 month free card. 15 day supply to complete remainder of treatment filled at [facility] so this can be billed to his [employer].  Past Medical History:  Diagnosis Date   Hypertension     History reviewed. No pertinent surgical history.  Social History   Socioeconomic History   Marital status: Married    Spouse name: Not on file   Number of children: Not on file   Years of education: Not on file   Highest education level: Not on file  Occupational History   Not on file  Tobacco Use   Smoking status: Never   Smokeless tobacco: Never  Vaping Use   Vaping status: Never Used  Substance and Sexual Activity   Alcohol use: Not Currently   Drug use: Never   Sexual activity: Not on file  Other Topics Concern   Not on file  Social History Narrative   Not on file   Social Drivers of Health   Tobacco Use: Low Risk (02/28/2024)   Patient History    Smoking Tobacco Use: Never    Smokeless Tobacco Use: Never    Passive Exposure: Not on file  Financial Resource Strain: Not on file  Food Insecurity: Not on file  Transportation Needs: Not on file  Physical Activity: Not on file  Stress: Not on file  Social Connections: Not on file  Intimate Partner Violence: Not on file  Depression (EYV7-0): Not on file  Alcohol Screen: Not on file  Housing: Not on file  Utilities: Not on file  Health Literacy: Not on file  Family History  Problem Relation Age of Onset   Healthy Mother    Healthy Father     Allergies as of 02/28/2024   (No Known Allergies)    Medications Ordered Prior to Encounter[1] REVIEW OF SYSTEMS:  Review of Systems  Respiratory:  Negative for shortness of breath.   Cardiovascular:  Positive for leg swelling. Negative for chest pain.   PHYSICAL EXAMINATION:  Vitals:   02/28/24 0934  BP: (!) 165/81  Pulse: 67   SpO2: 97%    There is no height or weight on file to calculate BMI.  Physical Exam Vitals reviewed.  Pulmonary:     Effort: Pulmonary effort is normal.  Musculoskeletal:     Right lower leg: Edema (1+) present.     Left lower leg: Edema (trace) present.  Neurological:     Mental Status: He is alert.  Psychiatric:        Mood and Affect: Mood normal.    Villalta Score for Post-Thrombotic Syndrome: Pain: Mild Cramps: Absent Heaviness: Absent Paresthesia: Absent Pruritus: Absent Pretibial Edema: Mild Skin Induration: Absent Hyperpigmentation: Absent Redness: Absent Venous Ectasia: Absent Pain on calf compression: Mild Villalta Preliminary Score: 3 Is venous ulcer present?: No If venous ulcer is present and score is <15, then 15 points total are assigned: Absent Villalta Total Score: 3  LABS:  04/14/2023: (from [facility]) CMP - Cr 1.38, eGFR 54, AST 21, ALT 18, Alkaline phosphatase 65, Total bilirubin 0.3 CBC - Hgb 12, Platelets 245  [facility] Vascular Lab Studies:  VAS US  LOWER EXTREMITY VENOUS (DVT)RIGHT: Summary:  RIGHT:  - Findings consistent with 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  - There is no evidence of deep vein thrombosis in the lower extremity.   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 No provoking factors for thrombus identified. Given saphenous vein involvement, will start on full dose anticoagulation with Eliquis  and plan to treat for a total of 45 days. No concerns on labs. Extensively counseled on Eliquis  and all of his questions were answered. Recommend to hold aspirin while he is taking Eliquis . He confirmed understanding. No medication adherence barriers identified. No follow  up imaging needed. He prefers to follow up with his PCP which is appropriate.  PLAN: -Start apixaban  (Eliquis ) 10 mg twice daily for 7 days followed by 5 mg twice daily. -Expected duration of therapy: 45 days. Therapy started on 02/28/2024. -Patient educated on purpose, proper use and potential adverse effects of apixaban  (Eliquis ). -Discussed importance of taking medication around the same time every day. -Advised patient of medications to avoid (NSAIDs, aspirin doses >100 mg daily). -Educated that Tylenol  (acetaminophen ) is the preferred analgesic to lower the risk of bleeding. -Advised patient to alert all providers of anticoagulation therapy prior to starting a new medication or having a procedure. -Emphasized importance of monitoring for signs and symptoms of bleeding (abnormal bruising, prolonged bleeding, nose bleeds, bleeding from gums, discolored urine, black tarry stools). -Educated patient to present to the ED if emergent signs and symptoms of new thrombosis occur. -Counseled patient to wear compression stockings daily, removing at night.  Follow up: with PCP, DVT Clinic available as needed  Izetta Henry, PharmD, CPP [facility] [phone]     [1]  Current Outpatient Medications  on File Prior to Visit  Medication Sig Dispense Refill   ascorbic acid (VITAMIN C) 1000 MG tablet Vitamin C 1,000 mg tablet  Take by oral route.     aspirin 81 MG EC tablet Adult Aspirin Regimen 81 mg tablet,delayed release  Take 1 tablet every day by oral route.     cetirizine (ZYRTEC) 10 MG tablet Aller-Tec 10 mg tablet  Take 1 tablet every day by oral route.     Cholecalciferol 25 MCG (1000 UT) tablet Vitamin D3 25 mcg (1,000 unit) tablet  Take by oral route.     Coenzyme Q10 100 MG capsule coenzyme Q10 100 mg capsule  1 capsule orally once a day (Patient taking differently: Take 200 mg by mouth daily.)     diclofenac  Sodium (VOLTAREN  ARTHRITIS  PAIN) 1 % GEL Apply 2 g topically 4 (four) times daily as needed.     metFORMIN (GLUCOPHAGE) 500 MG tablet metformin 500 mg tablet (Patient taking differently: Take 500 mg by mouth 2 (two) times daily with a meal.)     omeprazole (PRILOSEC) 40 MG capsule omeprazole 40 mg capsule,delayed release     oxybutynin (DITROPAN-XL) 5 MG 24 hr tablet TAKE 1 TABLET BY MOUTH EVERY DAY IN THE MORNING     rosuvastatin (CRESTOR) 20 MG tablet rosuvastatin 20 mg tablet  TAKE 1 TABLET DAILY     sildenafil (VIAGRA) 50 MG tablet sildenafil 50 mg tablet     tamsulosin (FLOMAX) 0.4 MG CAPS capsule Take 0.4 mg by mouth 2 (two) times daily.     valsartan (DIOVAN) 160 MG tablet Take 160 mg by mouth daily.     zolpidem (AMBIEN CR) 12.5 MG CR tablet zolpidem ER 12.5 mg tablet,extended release,multiphase  1 tablet orally at bedtime (Patient taking differently: Take 12.5 mg by mouth at bedtime.)     No current facility-administered medications on file prior to visit.   "

## 2024-03-02 ENCOUNTER — Telehealth: Payer: Self-pay | Admitting: Physician Assistant

## 2024-03-02 NOTE — Telephone Encounter (Signed)
 Pt wants to know if he can get a Rx for Diclofenac   [facility] on [address]

## 2024-03-12 ENCOUNTER — Other Ambulatory Visit (HOSPITAL_COMMUNITY): Payer: Self-pay

## 2024-03-29 ENCOUNTER — Ambulatory Visit: Admitting: Physician Assistant
